# Patient Record
Sex: Female | Born: 1980 | Race: White | Hispanic: No | Marital: Married | Smoking: Never smoker
Health system: Southern US, Community
[De-identification: ages and names within clinical notes are randomized; demographics above are authoritative.]

## PROBLEM LIST (undated history)

## (undated) ENCOUNTER — Inpatient Hospital Stay: Payer: Self-pay

## (undated) DIAGNOSIS — K219 Gastro-esophageal reflux disease without esophagitis: Secondary | ICD-10-CM

## (undated) DIAGNOSIS — R51 Headache: Secondary | ICD-10-CM

## (undated) DIAGNOSIS — F32A Depression, unspecified: Secondary | ICD-10-CM

## (undated) DIAGNOSIS — R519 Headache, unspecified: Secondary | ICD-10-CM

## (undated) DIAGNOSIS — F329 Major depressive disorder, single episode, unspecified: Secondary | ICD-10-CM

## (undated) DIAGNOSIS — J45909 Unspecified asthma, uncomplicated: Secondary | ICD-10-CM

## (undated) HISTORY — PX: WISDOM TOOTH EXTRACTION: SHX21

## (undated) HISTORY — PX: OTHER SURGICAL HISTORY: SHX169

## (undated) HISTORY — PX: GANGLION CYST EXCISION: SHX1691

## (undated) HISTORY — DX: Depression, unspecified: F32.A

## (undated) HISTORY — PX: TONSILLECTOMY AND ADENOIDECTOMY: SUR1326

## (undated) HISTORY — DX: Major depressive disorder, single episode, unspecified: F32.9

---

## 2007-02-12 ENCOUNTER — Emergency Department: Payer: Self-pay | Admitting: Emergency Medicine

## 2007-03-05 ENCOUNTER — Emergency Department: Payer: Self-pay | Admitting: Emergency Medicine

## 2007-05-08 ENCOUNTER — Encounter: Payer: Self-pay | Admitting: Maternal & Fetal Medicine

## 2007-06-23 ENCOUNTER — Observation Stay: Payer: Self-pay

## 2007-08-20 ENCOUNTER — Emergency Department: Payer: Self-pay | Admitting: Emergency Medicine

## 2007-08-24 ENCOUNTER — Observation Stay: Payer: Self-pay | Admitting: Unknown Physician Specialty

## 2007-10-03 ENCOUNTER — Inpatient Hospital Stay: Payer: Self-pay

## 2010-09-01 ENCOUNTER — Emergency Department: Payer: Self-pay | Admitting: Unknown Physician Specialty

## 2012-10-06 ENCOUNTER — Encounter: Payer: Self-pay | Admitting: Obstetrics and Gynecology

## 2012-10-20 ENCOUNTER — Encounter: Payer: Self-pay | Admitting: Maternal & Fetal Medicine

## 2013-03-05 ENCOUNTER — Inpatient Hospital Stay: Payer: Self-pay | Admitting: Obstetrics and Gynecology

## 2013-03-05 LAB — CBC WITH DIFFERENTIAL/PLATELET
Basophil #: 0 10*3/uL (ref 0.0–0.1)
Lymphocyte %: 15.5 %
MCH: 29.2 pg (ref 26.0–34.0)
MCHC: 34.5 g/dL (ref 32.0–36.0)
MCV: 84 fL (ref 80–100)
Monocyte %: 5.5 %
Neutrophil #: 7.8 10*3/uL — ABNORMAL HIGH (ref 1.4–6.5)
Neutrophil %: 78.6 %
Platelet: 143 10*3/uL — ABNORMAL LOW (ref 150–440)
RDW: 13.8 % (ref 11.5–14.5)
WBC: 9.9 10*3/uL (ref 3.6–11.0)

## 2014-11-12 NOTE — Consult Note (Signed)
Referral Information:  Reason for Referral History of macrosomia and shoulder dystocia after her first delivery   Referring Physician [facility] (Dr. Bonney Aid)   Prenatal Hx Patient states that she had a shoulder dystocia with her first delivery (9#1oz) that required an episiotomy for delivery; her daughter had no deficits on exam and required no special follow up.  She thinks that she has a "weak" shoulder, though, and that she sometimes has difficulty with cheerleading moves because of it.  Her next delivery (8#9oz) was uncomplicated other than a compound presentation and double nuchal cord.   Past Obstetrical Hx 1.  SAB age 34, first trimester, no compliations 2.  2004 42wk NVD of female infant, 9#1oz, shoulder dystocia requiring episiotomy; no deficits or special care received.  Sacral dimple. 3.  2009 40wk NVD of female infant, 8#9oz, no shoulder dystocia but delivery complicated by compound presentation and double nuchal cord requiring NICU resucitation; first apgar was 3.  Didn't required NICU admission, though.  Sacral dimple. 4.  Current   Home Medications: Medication Instructions Status  Prenatal 1 Prenatal Multivitamins with Folic Acid 1 mg oral capsule 1 cap(s) orally once a day Active  folic acid 1 mg oral tablet 3 tab(s) orally once a day Active   Allergies:   No Known Allergies:   Vital Signs/Notes:  Nursing Vital Signs: **Vital Signs.:   17-Mar-14 09:05  Vital Signs Type Routine  Temperature Temperature (F) 97.7  Celsius 36.5  Pulse Pulse 89  Respirations Respirations 16  Systolic BP Systolic BP 105  Diastolic BP (mmHg) Diastolic BP (mmHg) 56  Mean BP 72   Perinatal Consult:  LMP 02-Jun-2012   PGyn Hx Denies abnormal PAPs or STDs   Past Medical History cont'd Denies   PSurg Hx Ganglion cyst on left hand, deviated septum repair, tubes/adenoids/tonsillectomy   FHx Both GMs with diabetes; mom with thyroid problems; MGM died in her 76's ? 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  Pregnancy complicated by 2 children with sacral dimples but no evidence of open spina bifida (tetra screen with AFP was normal) and first delivery comlicated by macrosomia, in the absence of diabetes, and shoulder dystocia with no neurological squelae.   Recommendations 1.  Agree with plan for early glucola 2.  Will schedule detailed anatomy ultrasound 3.  Genetic counseling performed today (history updated from prior counseling session) due to 2 children with sacral dimple and husband with spina bifida occulta. 4.  As the patient's first child had no diagnosed sequelae from the shoulder dytocia and the the second pregnancy was uncomplicated from that standpoint, do not feel (nor does the patient) that primary c/s is warranted unless she develops diabetes and/or macrosomia in this pregnancy (4500 grams with diabetes, 5000 grams without). 5.  As the patient does have a history of larger babies, however, would consider third trimester ultrasound for growth to help with delivery management.   Plan:  Genetic Counseling yes   Prenatal Diagnosis Options  Level II US   Ultrasound at what gestational ages 5432-34 weeks   Delivery Mode Vaginal   Additional Testing Folate/prenatal vitamins    Total Time Spent with Patient 15 minutes   >50% of visit spent in couseling/coordination of care yes   Office Use Only [id number]  Level 1 (15min) NEW office consult prob focused   Coding  Description: FETAL - 2nd/3rd TRIMESTER INDICATION(S).   Large for date - Macrosomia.   MATERNAL CONDITIONS/HISTORY INDICATION(S).  Electronic Signatures: Kirby FunkEllestad, Chou Busler (MD)  (Signed 17-Mar-14 11:13)  Authored: Referral, Home Medications, Allergies, Vital Signs/Notes, Consult, Exam, Impression, Plan, Billing, Coding Description   Last Updated: 17-Mar-14 11:13 by Kirby FunkEllestad, Nakai Pollio (MD)

## 2017-12-20 ENCOUNTER — Encounter: Payer: Self-pay | Admitting: Maternal Newborn

## 2017-12-20 ENCOUNTER — Ambulatory Visit (INDEPENDENT_AMBULATORY_CARE_PROVIDER_SITE_OTHER): Payer: Medicaid Other | Admitting: Maternal Newborn

## 2017-12-20 VITALS — BP 100/80 | Ht 62.25 in | Wt 175.0 lb

## 2017-12-20 DIAGNOSIS — Z3490 Encounter for supervision of normal pregnancy, unspecified, unspecified trimester: Secondary | ICD-10-CM | POA: Insufficient documentation

## 2017-12-20 DIAGNOSIS — Z113 Encounter for screening for infections with a predominantly sexual mode of transmission: Secondary | ICD-10-CM

## 2017-12-20 DIAGNOSIS — Z3481 Encounter for supervision of other normal pregnancy, first trimester: Secondary | ICD-10-CM

## 2017-12-20 DIAGNOSIS — Z6831 Body mass index (BMI) 31.0-31.9, adult: Secondary | ICD-10-CM

## 2017-12-20 DIAGNOSIS — Z0189 Encounter for other specified special examinations: Secondary | ICD-10-CM

## 2017-12-20 NOTE — Progress Notes (Signed)
12/20/2017   Chief Complaint: Amenorrhea, positive home pregnancy test, desires prenatal care.  Transfer of Care Patient: no  History of Present Illness: [name] is a 37 y.o. [phone] at 6w 4d based on Patient's last menstrual period on 11/04/2017 (approximate), with an Estimated Date of Delivery: 08/11/2018 with the above CC.   Her periods were: regular periods every 28-32 days She was using no method when she conceived.  She has Positive signs or symptoms of nausea of pregnancy. She has Negative signs or symptoms of miscarriage or preterm labor She identifies Negative Zika risk factors for her and her partner On any different medications around the time she conceived/early pregnancy: No  History of varicella: Yes   ROS: She also reports fatigue, changes in appetite, nasal congestion, breast tenderness, frequent urination, and depression. Review of systems was otherwise negative, except as stated in the above HPI.  OBGYN History: As per HPI. OB History  Gravida Para Term Preterm AB Living  SAB TAB Ectopic Multiple Live Births  1       3    # Outcome Date GA Lbr Len/2nd Weight Sex Delivery Anes PTL Lv  5 Current           4 Term 03/05/13   7 lb 6 oz (3.345 kg) M Vag-Spont   LIV  3 Term 10/04/07 [redacted]w[redacted]d  8 lb 9 oz (3.884 kg) M Vag-Spont   LIV     Birth Comments: IOL AT 35.3WKS FOR HX SHOULDER DYSTOCIA.  2 Term 05/26/03 [redacted]w[redacted]d  9 lb 1 oz (4.111 kg) F Vag-Spont   LIV     Complications: Shoulder Dystocia  1 SAB             Any issues with any prior pregnancies: yes, SAB with G1 and shoulder dystocia with G2. Any prior children are healthy, doing well, without any problems or issues: yes History of pap smears: Yes. Last pap smear 08/27/2012.  NILM/HPV Negative  History of STIs: No   Past Medical History: Past Medical History:  Diagnosis Date  . Depression     Past Surgical History: Past Surgical History:  Procedure Laterality Date  . deviated septum repair     . GANGLION CYST EXCISION    . TONSILLECTOMY AND ADENOIDECTOMY     PART ADENOID  . tubes in ears    . WISDOM TOOTH EXTRACTION     BOTTOM TWO    Family History:  Family History  Problem Relation Age of Onset  . Diabetes Maternal Grandmother   . Heart disease Maternal Grandfather   . Diabetes Paternal Grandmother   . Alzheimer's disease Paternal Grandmother   . Cancer Other        OVARIAN   She denies any female cancers, bleeding or blood clotting disorders.  She reports a history of intellectual disability, birth defects or genetic disorders in her or the FOB's history: she has a son with autism and her husband had a neural tube defect at birth.  Social History:  Social History   Socioeconomic History  . Marital status: Married    Spouse name: Not on file  . Number of children: 3  . Years of education: 26  . Highest education level: Not on file  Occupational History  . Occupation: STAY AT HOME MOM  Social Needs  . Financial resource strain: Not on file  . Food insecurity:    Worry: Not on file    Inability:  Not on file  . Transportation needs:    Medical: Not on file    Non-medical: Not on file  Tobacco Use  . Smoking status: Never Smoker  . Smokeless tobacco: Never Used  Substance and Sexual Activity  . Alcohol use: Not Currently  . Drug use: Never  . Sexual activity: Yes    Birth control/protection: None  Lifestyle  . Physical activity:    Days per week: Not on file    Minutes per session: Not on file  . Stress: Not on file  Relationships  . Social connections:    Talks on phone: Not on file    Gets together: Not on file    Attends religious service: Not on file    Active member of club or organization: Not on file    Attends meetings of clubs or organizations: Not on file    Relationship status: Not on file  . Intimate partner violence:    Fear of current or ex partner: Not on file    Emotionally abused: Not on file    Physically abused: Not on file      Forced sexual activity: Not on file  Other Topics Concern  . Not on file  Social History Narrative  . Not on file   Any cats in the household: yes, aware to avoid cat litter and feces and her daughter changes the litter box. Denies history of and current domestic violence.  Allergy: No Known Allergies  Current Outpatient Medications: No current outpatient medications on file.   Physical Exam:   BP 100/80   Ht 5' 2.25" (1.581 m)   Wt 175 lb (79.4 kg)   LMP 11/04/2017 (Approximate)   BMI 31.75 kg/m  Body mass index is 31.75 kg/m. Constitutional: Well nourished, well developed female in no acute distress.  Neck:  Supple, normal appearance, and no thyromegaly  Cardiovascular: S1, S2 normal, no murmur, rub or gallop, regular rate and rhythm Respiratory:  Clear to auscultation bilaterally. Normal respiratory effort Abdomen: positive bowel sounds and no masses, hernias; diffusely non tender to palpation, non distended Breasts: breasts appear normal, no suspicious masses, no skin or nipple changes or axillary nodes. Neuro/Psych:  Normal mood and affect.  Skin:  Warm and dry.  Lymphatic:  No inguinal lymphadenopathy.   Pelvic exam: is not limited by body habitus External genitalia, Bartholin's glands, Urethra, Skene's glands: within normal limits Vagina: within normal limits and with no blood in the vault  Cervix: normal appearing cervix without discharge or lesions, closed/long/high Uterus:  enlarged, c/w early pregnancy Adnexa:  negative for mass; tenderness on left  Assessment: [name] is a 37 y.o. [id number] at 6w 4d based on Patient's last menstrual period on 11/04/2017 (approximate), with an Estimated Date of Delivery: 08/11/2018, presenting for prenatal care.  Plan:  1) Avoid alcoholic beverages. 2) Patient encouraged not to smoke.  3) Discontinue the use of all non-medicinal drugs and chemicals.  4) Take prenatal vitamins daily.  5) Seatbelt use advised 6)  Nutrition, food safety (fish, cheese advisories, and high nitrite foods) and exercise discussed. 7) Hospital and practice style delivering at [facility] discussed  8) Patient is asked about travel to areas at risk for the Zika virus, and counseled to avoid travel and exposure to mosquitoes or sexual partners who may have themselves been exposed to the virus. Testing is discussed, and will be ordered as appropriate.  9) Genetic Screening, such as with 1st Trimester Screening, cell free fetal DNA, AFP testing,  and Ultrasound, as well as with amniocentesis and CVS as appropriate, is discussed with patient. She plans to have genetic testing this pregnancy. 10) Early GTT with NOB labs. 11) Ultrasound ordered for dating/viability. 12) Bonjesta sample for nausea.  Problem list reviewed and updated.  Return in about 1 week (around 12/27/2017) for ROB following ultrasound/GTT/NOB labs.  Marcelyn Bruins, CNM [facility], [facility] 12/20/2017  10:49 AM

## 2017-12-20 NOTE — Progress Notes (Signed)
C/O some occ pain ovary pain - hyst of cyst; nausea.rj

## 2017-12-22 LAB — URINE CULTURE

## 2017-12-22 LAB — URINE DRUG PANEL 7
Amphetamines, Urine: NEGATIVE ng/mL
BARBITURATE QUANT UR: NEGATIVE ng/mL
BENZODIAZEPINE QUANT UR: NEGATIVE ng/mL
CANNABINOID QUANT UR: NEGATIVE ng/mL
Cocaine (Metab.): NEGATIVE ng/mL
Opiate Quant, Ur: NEGATIVE ng/mL
PCP QUANT UR: NEGATIVE ng/mL

## 2017-12-22 LAB — GC/CHLAMYDIA PROBE AMP
CHLAMYDIA, DNA PROBE: NEGATIVE
Neisseria gonorrhoeae by PCR: NEGATIVE

## 2017-12-24 ENCOUNTER — Other Ambulatory Visit: Payer: Medicaid Other

## 2017-12-24 ENCOUNTER — Ambulatory Visit (INDEPENDENT_AMBULATORY_CARE_PROVIDER_SITE_OTHER): Payer: Medicaid Other

## 2017-12-24 DIAGNOSIS — Z3481 Encounter for supervision of other normal pregnancy, first trimester: Secondary | ICD-10-CM

## 2017-12-24 DIAGNOSIS — Z0189 Encounter for other specified special examinations: Secondary | ICD-10-CM | POA: Diagnosis not present

## 2017-12-24 DIAGNOSIS — Z6831 Body mass index (BMI) 31.0-31.9, adult: Secondary | ICD-10-CM | POA: Diagnosis not present

## 2017-12-24 DIAGNOSIS — Z113 Encounter for screening for infections with a predominantly sexual mode of transmission: Secondary | ICD-10-CM

## 2017-12-25 ENCOUNTER — Encounter: Payer: Medicaid Other | Admitting: Advanced Practice Midwife

## 2017-12-25 LAB — RPR+RH+ABO+RUB AB+AB SCR+CB...
Antibody Screen: NEGATIVE
HEP B S AG: NEGATIVE
HIV SCREEN 4TH GENERATION: NONREACTIVE
Hematocrit: 37.3 % (ref 34.0–46.6)
Hemoglobin: 12.3 g/dL (ref 11.1–15.9)
MCH: 29.1 pg (ref 26.6–33.0)
MCHC: 33 g/dL (ref 31.5–35.7)
MCV: 88 fL (ref 79–97)
PLATELETS: 159 10*3/uL (ref 150–450)
RBC: 4.22 x10E6/uL (ref 3.77–5.28)
RDW: 13.9 % (ref 12.3–15.4)
RPR: NONREACTIVE
RUBELLA: 1.02 {index} (ref >0.99)
Rh Factor: POSITIVE
VARICELLA: 580 {index} (ref >165)
WBC: 6.4 10*3/uL (ref 3.4–10.8)

## 2017-12-25 LAB — GLUCOSE, 1 HOUR GESTATIONAL: Gestational Diabetes Screen: 105 mg/dL (ref 65–139)

## 2017-12-26 ENCOUNTER — Encounter: Payer: Medicaid Other | Admitting: Obstetrics and Gynecology

## 2017-12-28 ENCOUNTER — Emergency Department: Payer: Medicaid Other

## 2017-12-28 ENCOUNTER — Emergency Department
Admission: EM | Admit: 2017-12-28 | Discharge: 2017-12-28 | Disposition: A | Discharge status: Home / Self Care | Payer: Medicaid Other | Attending: Emergency Medicine | Admitting: Emergency Medicine

## 2017-12-28 ENCOUNTER — Encounter: Payer: Self-pay | Admitting: Emergency Medicine

## 2017-12-28 ENCOUNTER — Other Ambulatory Visit: Payer: Self-pay

## 2017-12-28 DIAGNOSIS — O200 Threatened abortion: Secondary | ICD-10-CM

## 2017-12-28 DIAGNOSIS — Z3A01 Less than 8 weeks gestation of pregnancy: Secondary | ICD-10-CM | POA: Diagnosis not present

## 2017-12-28 DIAGNOSIS — N939 Abnormal uterine and vaginal bleeding, unspecified: Secondary | ICD-10-CM

## 2017-12-28 DIAGNOSIS — O209 Hemorrhage in early pregnancy, unspecified: Secondary | ICD-10-CM | POA: Diagnosis present

## 2017-12-28 LAB — HCG, QUANTITATIVE, PREGNANCY: hCG, Beta Chain, Quant, S: 72249 m[IU]/mL — ABNORMAL HIGH (ref <5)

## 2017-12-28 NOTE — Discharge Instructions (Signed)
Fortunately today your ultrasound was reassuring.  Please follow-up with your OB gynecologist this coming Thursday as scheduled and return to the emergency department sooner for any concerns such as worsening pain, worsening bleeding, or for any other issues whatsoever.  It was a pleasure to take care of you today, and thank you for coming to our emergency department.  If you have any questions or concerns before leaving please ask the nurse to grab me and I'm more than happy to go through your aftercare instructions again.  If you were prescribed any opioid pain medication today such as Norco, Vicodin, Percocet, morphine, hydrocodone, or oxycodone please make sure you do not drive when you are taking this medication as it can alter your ability to drive safely.  If you have any concerns once you are home that you are not improving or are in fact getting worse before you can make it to your follow-up appointment, please do not hesitate to call 911 and come back for further evaluation.  Robin BrittleNeil Travonta Gill, MD  Results for orders placed or performed during the hospital encounter of 12/28/17  hCG, quantitative, pregnancy  Result Value Ref Range   hCG, Beta Chain, Quant, S 72,249 (H) <5 mIU/mL   [place] Ob Comp < 14 Wks  Result Date: 12/28/2017 CLINICAL DATA:  Vaginal bleeding EXAM: OBSTETRIC <14 WK US AND TRANSVAGINAL OB US TECHNIQUE: Both transabdominal and transvaginal ultrasound examinations were performed for complete evaluation of the gestation as well as the maternal uterus, adnexal regions, and pelvic cul-de-sac. Transvaginal technique was performed to assess early pregnancy. COMPARISON:  None. FINDINGS: Intrauterine gestational sac: Visualized Yolk sac:  Visualized Embryo: Visualized Cardiac Activity: Visualized Heart Rate: 119 bpm CRL:  7 mm   6 w   4 d                  US EDC: January 18, 2019 Subchorionic hemorrhage: There is a small subchorionic hemorrhage measuring 7 x 7 mm. Maternal uterus/adnexae:  Cervical os is closed. Right ovary measures 2.3 x 1.7 x 1.9 cm. Left ovary measures 4.9 x 3.1 x 4.1 cm. There is a cyst arising from the left ovary measuring 3.2 x 3.0 x 3.7 cm, likely a corpus luteum. No free pelvic fluid. IMPRESSION: Single live intrauterine gestation estimated gestational age of 6+ weeks. Small subchorionic hemorrhage. Left ovarian cyst, a likely rather large corpus luteum. No free pelvic fluid. Electronically Signed   By: Bretta BangWilliam  Woodruff III M.D.   On: 12/28/2017 08:33   [place] Ob Comp Less 14 Wks  Result Date: 12/25/2017 ULTRASOUND REPORT Patient Name: Robin French DOB: 12/27/1980 MRN: [id number] Location: [facility] Date of Service: 12/24/2017 Indications:dating Findings: Mason JimSingleton intrauterine pregnancy is visualized with a CRL consistent with 1757w1d gestation, giving an (U/S) EDD of 08/18/2018. The (U/S) EDD is not consistent with the clinically established EDD of 08/11/2018. FHR: 112 bpm CRL measurement: 4.6 mm Yolk sac is visualized and appears normal and early anatomy is normal. Amnion: visualized and appears normal Right Ovary is normal in appearance. Left Ovary is not normal appearance, contains a cyst measuring 4.08 x 3.12cm Corpus luteal cyst:  is not visualized Survey of the adnexa demonstrates no adnexal masses. There is no free peritoneal fluid in the cul de sac. Impression: 1. 4357w1d Viable Singleton Intrauterine pregnancy by U/S. 2. (U/S) EDD is not consistent with Clinically established EDD of 08/11/2018 3. EDD is now1/27/2020 based on today's ultrasound Recommendations: 1.Clinical correlation with the patient's History and Physical  Exam. Willette Alma, RDMS, RVT Based on ultrasound examination today, EDC changed to 08/11/2018 [place] Criteria for Re-dating Pregnancy Dating Dating Discrepancy [redacted]w[redacted]d >5 days [redacted]w[redacted]d - [redacted]w[redacted]d >7 days [redacted]w[redacted]d - [redacted]w[redacted]d >7 days [redacted]w[redacted]d - [redacted]w[redacted]d >10 days [redacted]w[redacted]d - [redacted]w[redacted]d >14 days [redacted]w[redacted]d and above >21 days ACOG Committee Opinion 700 May 2017 "Methods for Estimating  the Due Date" There is a viable singleton gestation.  The fetal biometry does not correlates with established dating. Detailed evaluation of the fetal anatomy is precluded by early gestational age.  It must be noted that a normal ultrasound particular at this early gestational age is unable to rule out fetal aneuploidy, risk of first trimester miscarriage, or anatomic birth defects. Vena Austria, MD, [facility], [facility] 12/25/2017, 7:17 AM   US Ob Transvaginal  Result Date: 12/28/2017 CLINICAL DATA:  Vaginal bleeding EXAM: OBSTETRIC <14 WK [place] AND TRANSVAGINAL OB US TECHNIQUE: Both transabdominal and transvaginal ultrasound examinations were performed for complete evaluation of the gestation as well as the maternal uterus, adnexal regions, and pelvic cul-de-sac. Transvaginal technique was performed to assess early pregnancy. COMPARISON:  None. FINDINGS: Intrauterine gestational sac: Visualized Yolk sac:  Visualized Embryo: Visualized Cardiac Activity: Visualized Heart Rate: 119 bpm CRL:  7 mm   6 w   4 d                  [place] EDC: January 18, 2019 Subchorionic hemorrhage: There is a small subchorionic hemorrhage measuring 7 x 7 mm. Maternal uterus/adnexae: Cervical os is closed. Right ovary measures 2.3 x 1.7 x 1.9 cm. Left ovary measures 4.9 x 3.1 x 4.1 cm. There is a cyst arising from the left ovary measuring 3.2 x 3.0 x 3.7 cm, likely a corpus luteum. No free pelvic fluid. IMPRESSION: Single live intrauterine gestation estimated gestational age of 6+ weeks. Small subchorionic hemorrhage. Left ovarian cyst, a likely rather large corpus luteum. No free pelvic fluid. Electronically Signed   By: Bretta Bang III M.D.   On: 12/28/2017 08:33

## 2017-12-28 NOTE — ED Notes (Signed)
MD at bedside. 

## 2017-12-28 NOTE — ED Provider Notes (Signed)
[facility] Emergency Department Provider Note  ____________________________________________   First MD Initiated Contact with Patient 12/28/17 [phone]     (approximate)  I have reviewed the triage vital signs and the nursing notes.   HISTORY  Chief Complaint Vaginal Bleeding    HPI Robin French is a 37 y.o. female who self presents the emergency department with painless vaginal bleeding that began this morning.  She is G5, P3 with one miscarriage in the past.  She is roughly 6 to [redacted] weeks pregnant with a desired but unplanned pregnancy.  She was not taking fertility medications.  She does not smoke.  She does have OB gynecology follow-up with [facility].  Her symptoms began suddenly this morning.  She currently has mild suprapubic cramping discomfort.  Nothing seems to make the symptoms better or worse.  Her pain is nonradiating.  Past Medical History:  Diagnosis Date  . Depression     Patient Active Problem List   Diagnosis Date Noted  . Supervision of normal pregnancy 12/20/2017  . Adult BMI 31.0-31.9 kg/sq m 12/20/2017    Past Surgical History:  Procedure Laterality Date  . deviated septum repair    . GANGLION CYST EXCISION    . TONSILLECTOMY AND ADENOIDECTOMY     PART ADENOID  . tubes in ears    . WISDOM TOOTH EXTRACTION     BOTTOM TWO    Prior to Admission medications   Not on File    Allergies Patient has no known allergies.  Family History  Problem Relation Age of Onset  . Diabetes Maternal Grandmother   . Heart disease Maternal Grandfather   . Diabetes Paternal Grandmother   . Alzheimer's disease Paternal Grandmother   . Cancer Other        OVARIAN    Social History Social History   Tobacco Use  . Smoking status: Never Smoker  . Smokeless tobacco: Never Used  Substance Use Topics  . Alcohol use: Not Currently  . Drug use: Never    Review of Systems Constitutional: No fever/chills Eyes: No visual  changes. ENT: No sore throat. Cardiovascular: Denies chest pain. Respiratory: Denies shortness of breath. Gastrointestinal: Positive for abdominal pain.  No nausea, no vomiting.  No diarrhea.  No constipation. Genitourinary: Negative for dysuria. Musculoskeletal: Negative for back pain. Skin: Negative for rash. Neurological: Negative for headaches, focal weakness or numbness.   ____________________________________________   PHYSICAL EXAM:  VITAL SIGNS: ED Triage Vitals  Enc Vitals Group     BP 12/28/17 0714 114/67     Pulse Rate 12/28/17 0714 83     Resp 12/28/17 0714 16     Temp 12/28/17 0714 97.9 F (36.6 C)     Temp Source 12/28/17 0714 Oral     SpO2 12/28/17 0714 100 %     Weight 12/28/17 0645 175 lb (79.4 kg)     Height 12/28/17 0645 5' 2.25" (1.581 m)     Head Circumference --      Peak Flow --      Pain Score 12/28/17 0645 2     Pain Loc --      Pain Edu? --      Excl. in GC? --     Constitutional: Alert and oriented x4 pleasant cooperative speaks full sentences Eyes: PERRL EOMI. Head: Atraumatic. Nose: No congestion/rhinnorhea. Mouth/Throat: No trismus Neck: No stridor.   Cardiovascular: Normal rate, regular rhythm. Peri Jefferson peripheral circulation. Respiratory: Normal respiratory effort.  No retractions.  Gastrointestinal: Soft nondistended nontender no rebound or guarding no peritonitis Musculoskeletal: No lower extremity edema   Neurologic:  Normal speech and language. No gross focal neurologic deficits are appreciated. Skin:  Skin is warm, dry and intact. No rash noted. Psychiatric: Mood and affect are normal. Speech and behavior are normal.    ____________________________________________   DIFFERENTIAL includes but not limited to  Threatened abortion, inevitable abortion, completed abortion, ectopic pregnancy ____________________________________________   LABS (all labs ordered are listed, but only abnormal results are displayed)  Labs Reviewed   HCG, QUANTITATIVE, PREGNANCY - Abnormal; Notable for the following components:      Result Value   hCG, Beta Chain, Quant, S 72,249 (*)    All other components within normal limits    Lab work reviewed by me shows appropriate hCG __________________________________________  EKG   ____________________________________________  RADIOLOGY  Pelvic ultrasound reviewed by me shows intrauterine pregnancy with subchorionic hematoma ____________________________________________   PROCEDURES  Procedure(s) performed: no  Procedures  Critical Care performed: no  Observation: no ____________________________________________   INITIAL IMPRESSION / ASSESSMENT AND PLAN / ED COURSE  Pertinent labs & imaging results that were available during my care of the patient were reviewed by me and considered in my medical decision making (see chart for details).      ----------------------------------------- 8:45 AM on 12/28/2017 -----------------------------------------  The patient has very minimal discomfort.  Scant bleeding.  Pelvic ultrasound shows intrauterine pregnancy with a small subchorionic hematoma.  She is Rh+.  She was not taking any fertility medications.  She has follow-up this coming week with her OB gynecologist.  Pelvic rest is been discussed.  No need for pelvic exam at this time.  Strict return precautions have been given and the patient verbalizes understanding agreement plan.  ____________________________________________   FINAL CLINICAL IMPRESSION(S) / ED DIAGNOSES  Final diagnoses:  Threatened abortion      NEW MEDICATIONS STARTED DURING THIS VISIT:  New Prescriptions   No medications on file     Note:  This document was prepared using Dragon voice recognition software and may include unintentional dictation errors.     Merrily Brittleifenbark, Hernandez Losasso, MD 12/28/17 [phone]

## 2017-12-28 NOTE — ED Triage Notes (Signed)
Pt ambulatory to triage with no diffiuclty. Pt reports approx 7 to [redacted] weeks pregnant [facility] Pt reports vaginal bleeding started this morning. Pt G5P3A1.

## 2017-12-28 NOTE — ED Notes (Signed)
Pt taken to US via stretcher

## 2017-12-28 NOTE — ED Notes (Signed)
Pt states vaginal bleeding that began this AM. Between 7 and [redacted] weeks pregnant. [facility], had scan done with heart beat present. Was told "large cyst on my left side." states uncomfortable feeling lower abd/pelvic. States "very small" clots present. Not enough bleeding for a pad an hour. Alert, oriented, ambulatory. No distress noted.

## 2017-12-29 ENCOUNTER — Encounter: Payer: Self-pay | Admitting: Maternal Newborn

## 2017-12-29 NOTE — Patient Instructions (Signed)
First Trimester of Pregnancy The first trimester of pregnancy is from week 1 until the end of week 13 (months 1 through 3). A week after a sperm fertilizes an egg, the egg will implant on the wall of the uterus. This embryo will begin to develop into a baby. Genes from you and your partner will form the baby. The female genes will determine whether the baby will be a boy or a girl. At 6-8 weeks, the eyes and face will be formed, and the heartbeat can be seen on ultrasound. At the end of 12 weeks, all the baby's organs will be formed. Now that you are pregnant, you will want to do everything you can to have a healthy baby. Two of the most important things are to get good prenatal care and to follow your health care provider's instructions. Prenatal care is all the medical care you receive before the baby's birth. This care will help prevent, find, and treat any problems during the pregnancy and childbirth. Body changes during your first trimester Your body goes through many changes during pregnancy. The changes vary from woman to woman.  You may gain or lose a couple of pounds at first.  You may feel sick to your stomach (nauseous) and you may throw up (vomit). If the vomiting is uncontrollable, call your health care provider.  You may tire easily.  You may develop headaches that can be relieved by medicines. All medicines should be approved by your health care provider.  You may urinate more often. Painful urination may mean you have a bladder infection.  You may develop heartburn as a result of your pregnancy.  You may develop constipation because certain hormones are causing the muscles that push stool through your intestines to slow down.  You may develop hemorrhoids or swollen veins (varicose veins).  Your breasts may begin to grow larger and become tender. Your nipples may stick out more, and the tissue that surrounds them (areola) may become darker.  Your gums may bleed and may be  sensitive to brushing and flossing.  Dark spots or blotches (chloasma, mask of pregnancy) may develop on your face. This will likely fade after the baby is born.  Your menstrual periods will stop.  You may have a loss of appetite.  You may develop cravings for certain kinds of food.  You may have changes in your emotions from day to day, such as being excited to be pregnant or being concerned that something may go wrong with the pregnancy and baby.  You may have more vivid and strange dreams.  You may have changes in your hair. These can include thickening of your hair, rapid growth, and changes in texture. Some women also have hair loss during or after pregnancy, or hair that feels dry or thin. Your hair will most likely return to normal after your baby is born.  What to expect at prenatal visits During a routine prenatal visit:  You will be weighed to make sure you and the baby are growing normally.  Your blood pressure will be taken.  Your abdomen will be measured to track your baby's growth.  The fetal heartbeat will be listened to between weeks 10 and 14 of your pregnancy.  Test results from any previous visits will be discussed.  Your health care provider may ask you:  How you are feeling.  If you are feeling the baby move.  If you have had any abnormal symptoms, such as leaking fluid, bleeding, severe headaches,   or abdominal cramping.  If you are using any tobacco products, including cigarettes, chewing tobacco, and electronic cigarettes.  If you have any questions.  Other tests that may be performed during your first trimester include:  Blood tests to find your blood type and to check for the presence of any previous infections. The tests will also be used to check for low iron levels (anemia) and protein on red blood cells (Rh antibodies). Depending on your risk factors, or if you previously had diabetes during pregnancy, you may have tests to check for high blood  sugar that affects pregnant women (gestational diabetes).  Urine tests to check for infections, diabetes, or protein in the urine.  An ultrasound to confirm the proper growth and development of the baby.  Fetal screens for spinal cord problems (spina bifida) and Down syndrome.  HIV (human immunodeficiency virus) testing. Routine prenatal testing includes screening for HIV, unless you choose not to have this test.  You may need other tests to make sure you and the baby are doing well.  Follow these instructions at home: Medicines  Follow your health care provider's instructions regarding medicine use. Specific medicines may be either safe or unsafe to take during pregnancy.  Take a prenatal vitamin that contains at least 600 micrograms (mcg) of folic acid.  If you develop constipation, try taking a stool softener if your health care provider approves. Eating and drinking  Eat a balanced diet that includes fresh fruits and vegetables, whole grains, good sources of protein such as meat, eggs, or tofu, and low-fat dairy. Your health care provider will help you determine the amount of weight gain that is right for you.  Avoid raw meat and uncooked cheese. These carry germs that can cause birth defects in the baby.  Eating four or five small meals rather than three large meals a day may help relieve nausea and vomiting. If you start to feel nauseous, eating a few soda crackers can be helpful. Drinking liquids between meals, instead of during meals, also seems to help ease nausea and vomiting.  Limit foods that are high in fat and processed sugars, such as fried and sweet foods.  To prevent constipation: ? Eat foods that are high in fiber, such as fresh fruits and vegetables, whole grains, and beans. ? Drink enough fluid to keep your urine clear or pale yellow. Activity  Exercise only as directed by your health care provider. Most women can continue their usual exercise routine during  pregnancy. Try to exercise for 30 minutes at least 5 days a week. Exercising will help you: ? Control your weight. ? Stay in shape. ? Be prepared for labor and delivery.  Experiencing pain or cramping in the lower abdomen or lower back is a good sign that you should stop exercising. Check with your health care provider before continuing with normal exercises.  Try to avoid standing for long periods of time. Move your legs often if you must stand in one place for a long time.  Avoid heavy lifting.  Wear low-heeled shoes and practice good posture.  You may continue to have sex unless your health care provider tells you not to. Relieving pain and discomfort  Wear a good support bra to relieve breast tenderness.  Take warm sitz baths to soothe any pain or discomfort caused by hemorrhoids. Use hemorrhoid cream if your health care provider approves.  Rest with your legs elevated if you have leg cramps or low back pain.  If you develop   varicose veins in your legs, wear support hose. Elevate your feet for 15 minutes, 3-4 times a day. Limit salt in your diet. Prenatal care  Schedule your prenatal visits by the twelfth week of pregnancy. They are usually scheduled monthly at first, then more often in the last 2 months before delivery.  Write down your questions. Take them to your prenatal visits.  Keep all your prenatal visits as told by your health care provider. This is important. Safety  Wear your seat belt at all times when driving.  Make a list of emergency phone numbers, including numbers for family, friends, the hospital, and police and fire departments. General instructions  Ask your health care provider for a referral to a local prenatal education class. Begin classes no later than the beginning of month 6 of your pregnancy.  Ask for help if you have counseling or nutritional needs during pregnancy. Your health care provider can offer advice or refer you to specialists for help  with various needs.  Do not use hot tubs, steam rooms, or saunas.  Do not douche or use tampons or scented sanitary pads.  Do not cross your legs for long periods of time.  Avoid cat litter boxes and soil used by cats. These carry germs that can cause birth defects in the baby and possibly loss of the fetus by miscarriage or stillbirth.  Avoid all smoking, herbs, alcohol, and medicines not prescribed by your health care provider. Chemicals in these products affect the formation and growth of the baby.  Do not use any products that contain nicotine or tobacco, such as cigarettes and e-cigarettes. If you need help quitting, ask your health care provider. You may receive counseling support and other resources to help you quit.  Schedule a dentist appointment. At home, brush your teeth with a soft toothbrush and be gentle when you floss. Contact a health care provider if:  You have dizziness.  You have mild pelvic cramps, pelvic pressure, or nagging pain in the abdominal area.  You have persistent nausea, vomiting, or diarrhea.  You have a bad smelling vaginal discharge.  You have pain when you urinate.  You notice increased swelling in your face, hands, legs, or ankles.  You are exposed to fifth disease or chickenpox.  You are exposed to German measles (rubella) and have never had it. Get help right away if:  You have a fever.  You are leaking fluid from your vagina.  You have spotting or bleeding from your vagina.  You have severe abdominal cramping or pain.  You have rapid weight gain or loss.  You vomit blood or material that looks like coffee grounds.  You develop a severe headache.  You have shortness of breath.  You have any kind of trauma, such as from a fall or a car accident. Summary  The first trimester of pregnancy is from week 1 until the end of week 13 (months 1 through 3).  Your body goes through many changes during pregnancy. The changes vary from  woman to woman.  You will have routine prenatal visits. During those visits, your health care provider will examine you, discuss any test results you may have, and talk with you about how you are feeling. This information is not intended to replace advice given to you by your health care provider. Make sure you discuss any questions you have with your health care provider. Document Released: 07/03/2001 Document Revised: 06/20/2016 Document Reviewed: 06/20/2016 Elsevier Interactive Patient Education  2018 [employer]   [employer]  

## 2018-01-02 ENCOUNTER — Ambulatory Visit (INDEPENDENT_AMBULATORY_CARE_PROVIDER_SITE_OTHER): Payer: Medicaid Other | Admitting: Obstetrics & Gynecology

## 2018-01-02 VITALS — BP 120/80 | Wt 175.0 lb

## 2018-01-02 DIAGNOSIS — Z3481 Encounter for supervision of other normal pregnancy, first trimester: Secondary | ICD-10-CM

## 2018-01-02 DIAGNOSIS — Z6831 Body mass index (BMI) 31.0-31.9, adult: Secondary | ICD-10-CM

## 2018-01-02 DIAGNOSIS — Z3A01 Less than 8 weeks gestation of pregnancy: Secondary | ICD-10-CM

## 2018-01-02 NOTE — Patient Instructions (Signed)

## 2018-01-02 NOTE — Progress Notes (Signed)
  Subjective  Fetal Movement? no Contractions? no Leaking Fluid? no Vaginal Bleeding? no No pain or bleeding, mild nasuea  Objective  BP 120/80   Wt 175 lb (79.4 kg)   LMP 11/04/2017 (Approximate)   BMI 31.75 kg/m  General: NAD Pumonary: no increased work of breathing Abdomen: gravid, non-tender Extremities: no edema Psychiatric: mood appropriate, affect full  Assessment  37 y.o. [id number] at 7844w3d by  08/18/2018, by Ultrasound presenting for routine prenatal visit  Plan   Problem List Items Addressed This Visit      Other   Supervision of normal pregnancy   Adult BMI 31.0-31.9 kg/sq m    Other Visit Diagnoses    [redacted] weeks gestation of pregnancy    -  Primary    [facility] preferred for PNV Desires PP BTL Desires genetic testing, next visit All other labs reviewed, normal, normal glc Prior [facility] and ovarian cyst by US discussed; monitor for sx's  Annamarie MajorPaul Donnavan Covault, MD, Merlinda FrederickFACOG [facility], [facility] 01/02/2018  8:48 AM

## 2018-01-03 ENCOUNTER — Telehealth: Payer: Self-pay

## 2018-01-03 MED ORDER — PROVIDA DHA 16-16-1.25-110 MG PO CAPS: 1.0000 | ORAL_CAPSULE | Freq: Every day | ORAL | 11 refills | Status: DC

## 2018-01-03 NOTE — Telephone Encounter (Signed)
Pt saw RPH yesterday. Was supposed to have rx for PNV sent to [facility] pharmacy. Pharmacy has not rcvd rx. Cb#[phone]

## 2018-01-03 NOTE — Telephone Encounter (Signed)
Done

## 2018-01-03 NOTE — Telephone Encounter (Signed)
Called pt. Unable to leave message. VM box full.

## 2018-01-13 ENCOUNTER — Encounter: Payer: Self-pay | Admitting: Maternal Newborn

## 2018-01-14 ENCOUNTER — Encounter: Payer: Self-pay | Admitting: Maternal Newborn

## 2018-01-15 ENCOUNTER — Ambulatory Visit (INDEPENDENT_AMBULATORY_CARE_PROVIDER_SITE_OTHER): Payer: Medicaid Other

## 2018-01-15 ENCOUNTER — Encounter: Payer: Self-pay | Admitting: Maternal Newborn

## 2018-01-15 ENCOUNTER — Ambulatory Visit (INDEPENDENT_AMBULATORY_CARE_PROVIDER_SITE_OTHER): Payer: Medicaid Other | Admitting: Maternal Newborn

## 2018-01-15 VITALS — BP 104/64 | Wt 183.0 lb

## 2018-01-15 DIAGNOSIS — R102 Pelvic and perineal pain: Principal | ICD-10-CM

## 2018-01-15 DIAGNOSIS — O3481 Maternal care for other abnormalities of pelvic organs, first trimester: Secondary | ICD-10-CM | POA: Diagnosis not present

## 2018-01-15 DIAGNOSIS — O26891 Other specified pregnancy related conditions, first trimester: Secondary | ICD-10-CM

## 2018-01-15 DIAGNOSIS — Z3A09 9 weeks gestation of pregnancy: Secondary | ICD-10-CM

## 2018-01-15 DIAGNOSIS — N8312 Corpus luteum cyst of left ovary: Secondary | ICD-10-CM | POA: Diagnosis not present

## 2018-01-15 DIAGNOSIS — Z3481 Encounter for supervision of other normal pregnancy, first trimester: Secondary | ICD-10-CM

## 2018-01-15 NOTE — Progress Notes (Signed)
    Routine Prenatal Care Visit  Subjective  Robin French is a 37 y.o. [id number] at 48712w2d being seen today for ongoing prenatal care.  She is currently monitored for the following issues for this low-risk pregnancy and has Supervision of normal pregnancy and Adult BMI 31.0-31.9 kg/sq m on their problem list.  ----------------------------------------------------------------------------------- Patient reports intermittent pain in the area of her left ovary. It resolves spontaneously. She has seen some pink on the toilet paper when wiping after a bowel movement. She is concerned about the subchorionic hemorrhage that was seen on ultrasound during a visit to the emergency department earlier this month. Contractions: Not present. Vag. Bleeding: None.   ----------------------------------------------------------------------------------- The following portions of the patient's history were reviewed and updated as appropriate: allergies, current medications, past family history, past medical history, past social history, past surgical history and problem list. Problem list updated.   Objective  Blood pressure 104/64, weight 183 lb (83 kg), last menstrual period 11/04/2017. Pregravid weight 175 lb (79.4 kg) Total Weight Gain 8 lb (3.629 kg)  Fetal Status: Fetal Heart Rate (bpm): 175         General:  Alert, oriented and cooperative. Patient is in no acute distress.  Skin: Skin is warm and dry. No rash noted.   Cardiovascular: Normal heart rate noted  Respiratory: Normal respiratory effort, no problems with respiration noted  Abdomen: Soft, gravid, appropriate for gestational age. Pain/Pressure: Present     Pelvic:  Cervical exam deferred        Extremities: Normal range of motion.     Mental Status: Normal mood and affect. Normal behavior. Normal judgment and thought content.     Assessment   37 y.o. [id number] at 22712w2d, EDD 08/18/2018 by Ultrasound presenting for routine prenatal  visit.  Plan   FIFTH Problems (from 12/20/17 to present)    Problem Noted Resolved   Supervision of normal pregnancy 12/20/2017 by Oswaldo ConroySchmid, Jacelyn Y, CNM No   Overview Addendum 12/29/2017  5:29 PM by Oswaldo ConroySchmid, Jacelyn Y, CNM    Clinic [facility] Prenatal Labs  Dating 6w 1d US Blood type: O/Positive/-- (06/04 1157)   Genetic Screen 1 Screen:    AFP:     Quad:     NIPS: Antibody:Negative (06/04 1157)  Anatomic US  Rubella: 1.02 (06/04 1157) Varicella: Immune  GTT Early: 105              Third trimester:  RPR: Non Reactive (06/04 1157)   Rhogam  HBsAg: Negative (06/04 1157)   TDaP vaccine                       Flu Shot: HIV: Non Reactive (06/04 1157)   Baby Food                                GBS:   Contraception  Pap: 2014 NILM/HPV negative, declines until PP  CBB     CS/VBAC    Support Person               Ultrasound today shows single IUP, size=dates, FHR 175. No SCH seen. Left ovarian cyst present. Reviewed normal ultrasound findings.  Return in about 2 weeks (around 01/29/2018) for ROB and NIPT.  Marcelyn BruinsJacelyn Schmid, CNM 01/16/2018  12:30 PM

## 2018-01-15 NOTE — Progress Notes (Signed)
C/o pelvic pain - left ovary area; sees pink c BM; [facility]. rj

## 2018-01-30 ENCOUNTER — Encounter: Payer: Self-pay | Admitting: Maternal Newborn

## 2018-01-30 ENCOUNTER — Ambulatory Visit (INDEPENDENT_AMBULATORY_CARE_PROVIDER_SITE_OTHER): Payer: Medicaid Other | Admitting: Maternal Newborn

## 2018-01-30 VITALS — BP 100/60 | Wt 184.0 lb

## 2018-01-30 DIAGNOSIS — Z1379 Encounter for other screening for genetic and chromosomal anomalies: Secondary | ICD-10-CM

## 2018-01-30 DIAGNOSIS — Z3481 Encounter for supervision of other normal pregnancy, first trimester: Secondary | ICD-10-CM | POA: Diagnosis not present

## 2018-01-30 DIAGNOSIS — Z3A11 11 weeks gestation of pregnancy: Secondary | ICD-10-CM

## 2018-01-30 NOTE — Progress Notes (Signed)
C/o hasn't been feeling very well, doesn't know if it's sinuses or what.rj

## 2018-01-30 NOTE — Progress Notes (Signed)
    Routine Prenatal Care Visit  Subjective  Robin SolianCarroll K Kenley French is a 37 y.o. [phone] at 2241w3d being seen today for ongoing prenatal care.  She is currently monitored for the following issues for this low-risk pregnancy and has Supervision of normal pregnancy and Adult BMI 31.0-31.9 kg/sq m on their problem list.  ---------------------------------------------------------------------------------- Patient reports sinus or allergy symptoms of nasal congestion, drainage, sore throat..   Contractions: Not present. Vag. Bleeding: None.    ---------------------------------------------------------------------------------- The following portions of the patient's history were reviewed and updated as appropriate: allergies, current medications, past family history, past medical history, past social history, past surgical history and problem list. Problem list updated.   Objective  Blood pressure 100/60, weight 184 lb (83.5 kg), last menstrual period 11/04/2017. Pregravid weight 175 lb (79.4 kg) Total Weight Gain 9 lb (4.082 kg) Body mass index is 33.38 kg/m. Urinalysis: Urine Protein: Negative Urine Glucose: Negative  Fetal Status: Fetal Heart Rate (bpm): 150         General:  Alert, oriented and cooperative. Patient is in no acute distress.  Skin: Skin is warm and dry. No rash noted.   Cardiovascular: Normal heart rate noted  Respiratory: Normal respiratory effort, no problems with respiration noted  Abdomen: Soft, gravid, appropriate for gestational age. Pain/Pressure: Absent     Pelvic:  Cervical exam deferred        Extremities: Normal range of motion.  Edema: None  Mental Status: Normal mood and affect. Normal behavior. Normal judgment and thought content.     Assessment   37 y.o. [id number] at 2341w3d, EDD 08/18/2018 by Ultrasound presenting for routine prenatal visit.  Plan   FIFTH Problems (from 12/20/17 to present)    Problem Noted Resolved   Supervision of normal pregnancy  12/20/2017 by Oswaldo ConroySchmid, Jacelyn Y, CNM No   Overview Addendum 12/29/2017  5:29 PM by Oswaldo ConroySchmid, Jacelyn Y, CNM    [facility]inic [facility] Prenatal Labs  Dating 6w 1d US Blood type: O/Positive/-- (06/04 1157)   Genetic Screen 1 Screen:    AFP:     Quad:     NIPS: Antibody:Negative (06/04 1157)  Anatomic US  Rubella: 1.02 (06/04 1157) Varicella: Immune  GTT Early: 105              Third trimester:  RPR: Non Reactive (06/04 1157)   Rhogam  HBsAg: Negative (06/04 1157)   TDaP vaccine                       Flu Shot: HIV: Non Reactive (06/04 1157)   Baby Food                                GBS:   Contraception  Pap: 2014 NILM/HPV negative, declines until PP  CBB     CS/VBAC    Support Person               Bedside ultrasound showed FHR 150, good fetal movement.  Advised OTC medications for relief of sinus/allergy symptoms.  MaterniTi 21 done today.  Gestational age appropriate obstetric precautions were reviewed.  Return in about 1 month (around 02/27/2018) for ROB.  Marcelyn BruinsJacelyn Schmid, CNM 01/30/2018  10:56 AM

## 2018-02-04 ENCOUNTER — Telehealth: Payer: Self-pay | Admitting: Maternal Newborn

## 2018-02-04 NOTE — Telephone Encounter (Signed)
Patient is requesting results from NPIT. Patient will be unable to be reach via phone. Please send Mychart message.

## 2018-02-05 NOTE — Telephone Encounter (Signed)
Pt calling again for results of NIPT Test.

## 2018-02-05 NOTE — Telephone Encounter (Signed)
Reviewed chart. Results are not back yet. Pt notified

## 2018-02-06 ENCOUNTER — Encounter: Payer: Self-pay | Admitting: Maternal Newborn

## 2018-02-06 ENCOUNTER — Other Ambulatory Visit: Payer: Self-pay

## 2018-02-06 ENCOUNTER — Emergency Department
Admission: EM | Admit: 2018-02-06 | Discharge: 2018-02-06 | Disposition: A | Discharge status: Home / Self Care | Payer: Medicaid Other | Attending: Student in an Organized Health Care Education/Training Program | Admitting: Student in an Organized Health Care Education/Training Program

## 2018-02-06 ENCOUNTER — Encounter: Payer: Self-pay | Admitting: Emergency Medicine

## 2018-02-06 DIAGNOSIS — R509 Fever, unspecified: Secondary | ICD-10-CM | POA: Diagnosis not present

## 2018-02-06 DIAGNOSIS — R51 Headache: Secondary | ICD-10-CM | POA: Diagnosis not present

## 2018-02-06 DIAGNOSIS — J020 Streptococcal pharyngitis: Secondary | ICD-10-CM | POA: Insufficient documentation

## 2018-02-06 DIAGNOSIS — R109 Unspecified abdominal pain: Secondary | ICD-10-CM | POA: Diagnosis not present

## 2018-02-06 DIAGNOSIS — R070 Pain in throat: Secondary | ICD-10-CM | POA: Diagnosis present

## 2018-02-06 LAB — GROUP A STREP BY PCR: GROUP A STREP BY PCR: DETECTED — AB

## 2018-02-06 MED ORDER — AMOXICILLIN 500 MG PO TABS: 500.0000 mg | ORAL_TABLET | Freq: Two times a day (BID) | ORAL | 0 refills | Status: AC

## 2018-02-06 MED ORDER — AMOXICILLIN 500 MG PO CAPS
500.0000 mg | ORAL_CAPSULE | Freq: Once | ORAL | Status: AC
  Administered 2018-02-06: 500 mg via ORAL
  Filled 2018-02-06: qty 1

## 2018-02-06 NOTE — ED Provider Notes (Signed)
[facility] Emergency Department Provider Note  ____________________________________________  Time seen: Approximately 11:17 PM  I have reviewed the triage vital signs and the nursing notes.   HISTORY  Chief Complaint Sore Throat   Historian Mother    HPI Robin French is a 37 y.o. female presents to the emergency department with pharyngitis, headache, abdominal discomfort and anterior neck discomfort for the past 2 days.  Patient has had low-grade fever at home.  No associated rhinorrhea, congestion or nonproductive cough.  No chest pain or abdominal pain.  No alleviating measures of been attempted.   Past Medical History:  Diagnosis Date  . Depression      Immunizations up to date:  Yes.     Past Medical History:  Diagnosis Date  . Depression     Patient Active Problem List   Diagnosis Date Noted  . Supervision of normal pregnancy 12/20/2017  . Adult BMI 31.0-31.9 kg/sq m 12/20/2017    Past Surgical History:  Procedure Laterality Date  . deviated septum repair    . GANGLION CYST EXCISION    . TONSILLECTOMY AND ADENOIDECTOMY     PART ADENOID  . tubes in ears    . WISDOM TOOTH EXTRACTION     BOTTOM TWO    Prior to Admission medications   Medication Sig Start Date End Date Taking? Authorizing Provider  amoxicillin (AMOXIL) 500 MG tablet Take 1 tablet (500 mg total) by mouth 2 (two) times daily for 10 days. 02/06/18 02/16/18  Orvil Feil, PA-C  Prenat-FeFum-FePo-FA-DHA w/o A (PROVIDA DHA) 16-16-1.25-110 MG CAPS Take 1 capsule by mouth daily. 01/03/18   Nadara Mustard, MD    Allergies Patient has no known allergies.  Family History  Problem Relation Age of Onset  . Diabetes Maternal Grandmother   . Heart disease Maternal Grandfather   . Diabetes Paternal Grandmother   . Alzheimer's disease Paternal Grandmother   . Cancer Other        OVARIAN    Social History Social History   Tobacco Use  . Smoking status:  Never Smoker  . Smokeless tobacco: Never Used  Substance Use Topics  . Alcohol use: Not Currently  . Drug use: Never     Review of Systems  Constitutional: Patient has fever. Eyes:  No discharge ENT: Patient has pharyngitis. Respiratory: no cough. No SOB/ use of accessory muscles to breath Gastrointestinal:   No nausea, no vomiting.  No diarrhea.  No constipation. Musculoskeletal: Negative for musculoskeletal pain. Skin: Negative for rash, abrasions, lacerations, ecchymosis.    ____________________________________________   PHYSICAL EXAM:  VITAL SIGNS: ED Triage Vitals  Enc Vitals Group     BP 02/06/18 2153 (!) 97/59     Pulse Rate 02/06/18 2153 95     Resp 02/06/18 2153 16     Temp 02/06/18 2153 99.1 F (37.3 C)     Temp Source 02/06/18 2153 Oral     SpO2 02/06/18 2153 100 %     Weight 02/06/18 2148 184 lb (83.5 kg)     Height 02/06/18 2148 5\' 2"  (1.575 m)     Head Circumference --      Peak Flow --      Pain Score 02/06/18 2148 8     Pain Loc --      Pain Edu? --      Excl. in GC? --      Constitutional: Alert and oriented. Well appearing and in no acute distress. Eyes: Conjunctivae are  normal. PERRL. EOMI. Head: Atraumatic. ENT:      Ears:  TMs are scarred bilaterally from prior otitis media.      Nose: No congestion/rhinnorhea.      Mouth/Throat: Mucous membranes are moist.  Posterior pharynx is erythematous with tonsillectomy sites visualized. Hematological/Lymphatic/Immunilogical: Palpable cervical lymphadenopathy. Cardiovascular: Normal rate, regular rhythm. Normal S1 and S2.  Good peripheral circulation. Respiratory: Normal respiratory effort without tachypnea or retractions. Lungs CTAB. Good air entry to the bases with no decreased or absent breath sounds Musculoskeletal: Full range of motion to all extremities. No obvious deformities noted Neurologic:  Normal for age. No gross focal neurologic deficits are appreciated.  Skin:  Skin is warm, dry and  intact. No rash noted.  ____________________________________________   LABS (all labs ordered are listed, but only abnormal results are displayed)  Labs Reviewed  GROUP A STREP BY PCR - Abnormal; Notable for the following components:      Result Value   Group A Strep by PCR DETECTED (*)    All other components within normal limits   ____________________________________________  EKG   ____________________________________________  RADIOLOGY   No results found.  ____________________________________________    PROCEDURES  Procedure(s) performed:     Procedures     Medications  amoxicillin (AMOXIL) capsule 500 mg (has no administration in time range)     ____________________________________________   INITIAL IMPRESSION / ASSESSMENT AND PLAN / ED COURSE  Pertinent labs & imaging results that were available during my care of the patient were reviewed by me and considered in my medical decision making (see chart for details).     Assessment and plan Strep pharyngitis Patient presents to the emergency department with headache, low-grade fever, pharyngitis, tender cervical lymphadenopathy and absence of cough.  Patient tested positive for group A strep in the emergency department.  She was treated with her first dose of amoxicillin in the emergency department.  She was discharged with amoxicillin.  Patient was advised to change her toothbrush after finishing amoxicillin.  Vital signs were reassuring prior to discharge.  All patient questions were answered.     ____________________________________________  FINAL CLINICAL IMPRESSION(S) / ED DIAGNOSES  Final diagnoses:  Strep throat      NEW MEDICATIONS STARTED DURING THIS VISIT:  ED Discharge Orders        Ordered    amoxicillin (AMOXIL) 500 MG tablet  2 times daily     02/06/18 2315          This chart was dictated using voice recognition software/Dragon. Despite best efforts to proofread,  errors can occur which can change the meaning. Any change was purely unintentional.     Orvil FeilWoods, Jayel Scaduto M, PA-C 02/06/18 2320    Willy Eddyobinson, Patrick, MD 02/06/18 [phone]

## 2018-02-06 NOTE — ED Notes (Signed)
Patient c/o sore throat X 2 days. Patient's throat is reddened on inspection.

## 2018-02-06 NOTE — ED Triage Notes (Signed)
Patient ambulatory to triage with steady gait, without difficulty or distress noted; pt reports sore throat since yesterday with body aches

## 2018-02-07 NOTE — Telephone Encounter (Signed)
Patient is calling for results

## 2018-02-09 LAB — MATERNIT 21 PLUS CORE, BLOOD
CHROMOSOME 13: NEGATIVE
CHROMOSOME 18: NEGATIVE
CHROMOSOME 21: NEGATIVE
Y CHROMOSOME: DETECTED

## 2018-02-10 NOTE — Telephone Encounter (Signed)
Returned call but uncertain if the number is correct and did not want to leave patient results on machine in case it is not. Sent through [employer], happy to discuss if Noralyn PickCarroll calls back.

## 2018-02-11 ENCOUNTER — Emergency Department
Admission: EM | Admit: 2018-02-11 | Discharge: 2018-02-11 | Disposition: A | Discharge status: Home / Self Care | Payer: Medicaid Other | Attending: Emergency Medicine | Admitting: Emergency Medicine

## 2018-02-11 ENCOUNTER — Emergency Department: Payer: Medicaid Other

## 2018-02-11 ENCOUNTER — Other Ambulatory Visit: Payer: Self-pay

## 2018-02-11 DIAGNOSIS — Z634 Disappearance and death of family member: Secondary | ICD-10-CM | POA: Diagnosis not present

## 2018-02-11 DIAGNOSIS — O09521 Supervision of elderly multigravida, first trimester: Secondary | ICD-10-CM | POA: Diagnosis not present

## 2018-02-11 DIAGNOSIS — O200 Threatened abortion: Secondary | ICD-10-CM | POA: Diagnosis not present

## 2018-02-11 DIAGNOSIS — O209 Hemorrhage in early pregnancy, unspecified: Secondary | ICD-10-CM | POA: Diagnosis not present

## 2018-02-11 DIAGNOSIS — B373 Candidiasis of vulva and vagina: Secondary | ICD-10-CM

## 2018-02-11 DIAGNOSIS — Z3A13 13 weeks gestation of pregnancy: Secondary | ICD-10-CM | POA: Insufficient documentation

## 2018-02-11 DIAGNOSIS — R1030 Lower abdominal pain, unspecified: Secondary | ICD-10-CM | POA: Diagnosis present

## 2018-02-11 DIAGNOSIS — O9989 Other specified diseases and conditions complicating pregnancy, childbirth and the puerperium: Secondary | ICD-10-CM | POA: Diagnosis not present

## 2018-02-11 DIAGNOSIS — O23591 Infection of other part of genital tract in pregnancy, first trimester: Secondary | ICD-10-CM | POA: Insufficient documentation

## 2018-02-11 DIAGNOSIS — B3731 Acute candidiasis of vulva and vagina: Secondary | ICD-10-CM

## 2018-02-11 LAB — URINALYSIS, COMPLETE (UACMP) WITH MICROSCOPIC
Bilirubin Urine: NEGATIVE
Glucose, UA: NEGATIVE mg/dL
Ketones, ur: NEGATIVE mg/dL
Nitrite: NEGATIVE
PH: 5 (ref 5.0–8.0)
Protein, ur: NEGATIVE mg/dL
SPECIFIC GRAVITY, URINE: 1.02 (ref 1.005–1.030)

## 2018-02-11 LAB — COMPREHENSIVE METABOLIC PANEL
ALK PHOS: 51 U/L (ref 38–126)
ALT: 14 U/L (ref 0–44)
AST: 18 U/L (ref 15–41)
Albumin: 3.2 g/dL — ABNORMAL LOW (ref 3.5–5.0)
Anion gap: 6 (ref 5–15)
BILIRUBIN TOTAL: 0.3 mg/dL (ref 0.3–1.2)
BUN: 10 mg/dL (ref 6–20)
CALCIUM: 8.9 mg/dL (ref 8.9–10.3)
CO2: 26 mmol/L (ref 22–32)
CREATININE: 0.58 mg/dL (ref 0.44–1.00)
Chloride: 106 mmol/L (ref 98–111)
Glucose, Bld: 114 mg/dL — ABNORMAL HIGH (ref 70–99)
Potassium: 3.5 mmol/L (ref 3.5–5.1)
Sodium: 138 mmol/L (ref 135–145)
TOTAL PROTEIN: 7.2 g/dL (ref 6.5–8.1)

## 2018-02-11 LAB — CBC
HCT: 35.9 % (ref 35.0–47.0)
Hemoglobin: 12.2 g/dL (ref 12.0–16.0)
MCH: 30.2 pg (ref 26.0–34.0)
MCHC: 34.1 g/dL (ref 32.0–36.0)
MCV: 88.7 fL (ref 80.0–100.0)
PLATELETS: 193 10*3/uL (ref 150–440)
RBC: 4.05 MIL/uL (ref 3.80–5.20)
RDW: 13.9 % (ref 11.5–14.5)
WBC: 8.4 10*3/uL (ref 3.6–11.0)

## 2018-02-11 LAB — WET PREP, GENITAL
Clue Cells Wet Prep HPF POC: NONE SEEN
Sperm: NONE SEEN
Trich, Wet Prep: NONE SEEN

## 2018-02-11 LAB — CHLAMYDIA/NGC RT PCR (ARMC ONLY)
Chlamydia Tr: NOT DETECTED
N GONORRHOEAE: NOT DETECTED

## 2018-02-11 LAB — HCG, QUANTITATIVE, PREGNANCY: hCG, Beta Chain, Quant, S: 33625 m[IU]/mL — ABNORMAL HIGH (ref <5)

## 2018-02-11 LAB — LIPASE, BLOOD: Lipase: 42 U/L (ref 11–51)

## 2018-02-11 LAB — POCT PREGNANCY, URINE: PREG TEST UR: POSITIVE — AB

## 2018-02-11 MED ORDER — FLUCONAZOLE 100 MG PO TABS
150.0000 mg | ORAL_TABLET | Freq: Once | ORAL | Status: AC
  Administered 2018-02-11: 150 mg via ORAL
  Filled 2018-02-11: qty 1

## 2018-02-11 MED ORDER — CEPHALEXIN 500 MG PO CAPS: 500.0000 mg | ORAL_CAPSULE | Freq: Once | ORAL | Status: DC

## 2018-02-11 NOTE — ED Notes (Signed)
Patient transported to Ultrasound 

## 2018-02-11 NOTE — ED Triage Notes (Addendum)
Pt arrives to ED via POV with c/o bilateral lower abdominal pain and vaginal bleeding. Pt reports abdominal pain x "a few days" and "bleeding that started about an hour ago". Pt denies any ROM or loss of amniotic fluids; pt reports she hasn't "saturated a pad yet". Pt is A&O, in NAD; RR even regular, and unlabored; skin color/temp is WNL.

## 2018-02-11 NOTE — ED Notes (Signed)

## 2018-02-11 NOTE — Discharge Instructions (Addendum)
1.  Continue and finish your antibiotic.  You will be notified of any positive urine culture which requires us to change your antibiotic. 2.  Do not use tampons, douche or have sexual intercourse until seen by your doctor. 3.  Return to the ER for worsening symptoms, persistent vomiting, soaking more than 1 pad per hour, fainting or other concerns.

## 2018-02-11 NOTE — ED Provider Notes (Signed)
[facility] Emergency Department Provider Note   ____________________________________________   First MD Initiated Contact with Patient 02/11/18 [phone]     (approximate)  I have reviewed the triage vital signs and the nursing notes.   HISTORY  Chief Complaint Abdominal Pain and Vaginal Bleeding    HPI Robin French is a 37 y.o. female G5, P4 approximately [redacted] weeks pregnant who presents with abdominal pain and vaginal spotting.  Patient was seen in the ED and started on amoxicillin a few days ago for strep throat.  States she has had 2 deaths in the family since so her stomach has been "tore up" with cramping and diarrhea for the past few days.  Last sexual intercourse 2 days ago.  Reports vaginal spotting which began approximately 11 PM.  Denies associated dizziness, chest pain, shortness of breath, nausea, vomiting, dysuria or diarrhea.  Denies recent travel or trauma.  Tells me she had a subchorionic hemorrhage on a 6-week ultrasound at her OB/GYN's office but it had resolved on a repeat ultrasound done at the ninth week.  Past Medical History:  Diagnosis Date  . Depression     Patient Active Problem List   Diagnosis Date Noted  . Supervision of normal pregnancy 12/20/2017  . Adult BMI 31.0-31.9 kg/sq m 12/20/2017    Past Surgical History:  Procedure Laterality Date  . deviated septum repair    . GANGLION CYST EXCISION    . TONSILLECTOMY AND ADENOIDECTOMY     PART ADENOID  . tubes in ears    . WISDOM TOOTH EXTRACTION     BOTTOM TWO    Prior to Admission medications   Medication Sig Start Date End Date Taking? Authorizing Provider  amoxicillin (AMOXIL) 500 MG tablet Take 1 tablet (500 mg total) by mouth 2 (two) times daily for 10 days. 02/06/18 02/16/18  Orvil Feil, PA-C  Prenat-FeFum-FePo-FA-DHA w/o A (PROVIDA DHA) 16-16-1.25-110 MG CAPS Take 1 capsule by mouth daily. 01/03/18   Nadara Mustard, MD    Allergies Patient has no  known allergies.  Family History  Problem Relation Age of Onset  . Diabetes Maternal Grandmother   . Heart disease Maternal Grandfather   . Diabetes Paternal Grandmother   . Alzheimer's disease Paternal Grandmother   . Cancer Other        OVARIAN    Social History Social History   Tobacco Use  . Smoking status: Never Smoker  . Smokeless tobacco: Never Used  Substance Use Topics  . Alcohol use: Not Currently  . Drug use: Never    Review of Systems  Constitutional: No fever/chills Eyes: No visual changes. ENT: No sore throat. Cardiovascular: Denies chest pain. Respiratory: Denies shortness of breath. Gastrointestinal: Positive for pelvic cramping.  No abdominal pain.  No nausea, no vomiting.  No diarrhea.  No constipation. Genitourinary: Positive for vaginal spotting.  Negative for dysuria. Musculoskeletal: Negative for back pain. Skin: Negative for rash. Neurological: Negative for headaches, focal weakness or numbness.   ____________________________________________   PHYSICAL EXAM:  VITAL SIGNS: ED Triage Vitals  Enc Vitals Group     BP 02/11/18 0039 (!) 107/56     Pulse Rate 02/11/18 0039 82     Resp 02/11/18 0039 18     Temp 02/11/18 0039 98.6 F (37 C)     Temp Source 02/11/18 0039 Oral     SpO2 02/11/18 0039 99 %     Weight 02/11/18 0040 184 lb (83.5 kg)  Height 02/11/18 0040 5\' 2"  (1.575 m)     Head Circumference --      Peak Flow --      Pain Score 02/11/18 0040 4     Pain Loc --      Pain Edu? --      Excl. in GC? --     Constitutional: Alert and oriented. Well appearing and in no acute distress. Eyes: Conjunctivae are normal. PERRL. EOMI. Head: Atraumatic. Nose: No congestion/rhinnorhea. Mouth/Throat: Mucous membranes are moist.  Oropharynx non-erythematous. Neck: No stridor.   Cardiovascular: Normal rate, regular rhythm. Grossly normal heart sounds.  Good peripheral circulation. Respiratory: Normal respiratory effort.  No retractions.  Lungs CTAB. Gastrointestinal: Soft and nontender to light or deep palpation. No distention. No abdominal bruits. No CVA tenderness. Musculoskeletal: No lower extremity tenderness nor edema.  No joint effusions. Neurologic:  Normal speech and language. No gross focal neurologic deficits are appreciated. No gait instability. Skin:  Skin is warm, dry and intact. No rash noted. Psychiatric: Mood and affect are normal. Speech and behavior are normal.  ____________________________________________   LABS (all labs ordered are listed, but only abnormal results are displayed)  Labs Reviewed  WET PREP, GENITAL - Abnormal; Notable for the following components:      Result Value   Yeast Wet Prep HPF POC PRESENT (*)    WBC, Wet Prep HPF POC FEW (*)    All other components within normal limits  COMPREHENSIVE METABOLIC PANEL - Abnormal; Notable for the following components:   Glucose, Bld 114 (*)    Albumin 3.2 (*)    All other components within normal limits  URINALYSIS, COMPLETE (UACMP) WITH MICROSCOPIC - Abnormal; Notable for the following components:   Color, Urine YELLOW (*)    APPearance CLOUDY (*)    Hgb urine dipstick LARGE (*)    Leukocytes, UA MODERATE (*)    Bacteria, UA MANY (*)    All other components within normal limits  HCG, QUANTITATIVE, PREGNANCY - Abnormal; Notable for the following components:   hCG, Beta Chain, Quant, S 33,625 (*)    All other components within normal limits  POCT PREGNANCY, URINE - Abnormal; Notable for the following components:   Preg Test, Ur POSITIVE (*)    All other components within normal limits  CHLAMYDIA/NGC RT PCR ([facility] ONLY)  URINE CULTURE  LIPASE, BLOOD  CBC  POC URINE PREG, ED   ____________________________________________  EKG  None ____________________________________________  RADIOLOGY  ED MD interpretation: 13-week 3-day IUP  Official radiology report(s): [place] Ob Comp Less 14 Wks  Result Date: 02/11/2018 CLINICAL  DATA:  Vaginal bleeding for 4 hours. Lower abdominal pain for several days. Positive urine pregnancy test. Estimated gestational age by LMP is 14 weeks 1 day. EXAM: OBSTETRIC <14 WK ULTRASOUND TECHNIQUE: Transabdominal ultrasound was performed for evaluation of the gestation as well as the maternal uterus and adnexal regions. COMPARISON:  12/28/2017 FINDINGS: Intrauterine gestational sac: A single intrauterine pregnancy is identified. Yolk sac: Yolk sac is not visualized, consistent with gestational age. Embryo:  Fetal pole is present. Cardiac Activity: Fetal cardiac activity is observed. Heart Rate: 158 bpm CRL: 72.5 mm   13 w 3 d                  US EDC: 08/16/2018 Subchorionic hemorrhage:  None. Maternal uterus/adnexae: Uterus is anteverted. No myometrial mass lesions. The ovaries are visualized and appear normal. No abnormal adnexal masses. No free fluid. IMPRESSION: Single intrauterine pregnancy. Estimated gestational age  by crown-rump length is 13 weeks 3 days, representing appropriate interval growth since the previous study. No acute complication is suggested sonographically. Electronically Signed   By: Burman Nieves M.D.   On: 02/11/2018 04:56    ____________________________________________   PROCEDURES  Procedure(s) performed:   Pelvic exam: External exam within normal limits without rashes, lesions or vesicles.  Speculum exam reveals minimal vaginal bleeding.  Cervical os is closed.  Bimanual exam unremarkable..  Procedures  Critical Care performed: No  ____________________________________________   INITIAL IMPRESSION / ASSESSMENT AND PLAN / ED COURSE  As part of my medical decision making, I reviewed the following data within the electronic MEDICAL RECORD NUMBER Nursing notes reviewed and incorporated, Labs reviewed, Old chart reviewed, Radiograph reviewed  and Notes from prior ED visits   37 year old female approximately [redacted] weeks pregnant who presents with pelvic cramping and  vaginal spotting. Differential diagnosis includes, but is not limited to, ovarian cyst, ovarian torsion, acute appendicitis, diverticulitis, urinary tract infection/pyelonephritis, endometriosis, bowel obstruction, colitis, renal colic, gastroenteritis, hernia, fibroids, endometriosis, pregnancy related pain including ectopic pregnancy, etc.  I have personally reviewed patient's chart and note her ultrasound at 9 weeks and her O+ blood type.  She is currently on amoxicillin for strep throat.  This should also cover her UTI.  We will add urine culture.  Will proceed with pelvic ultrasound.  Clinical Course as of Feb 12 604  Tue Feb 11, 2018  0601 Updated patient on ultrasound results.  Will give single dose Diflucan 150 mg orally for a yeast infection.  Pelvic rest precautions given.  Strict return precautions given.  Patient verbalizes understanding and agrees with plan of care.   [JS]    Clinical Course User Index [JS] Irean Hong, MD     ____________________________________________   FINAL CLINICAL IMPRESSION(S) / ED DIAGNOSES  Final diagnoses:  Threatened miscarriage  Yeast vaginitis     ED Discharge Orders    None       Note:  This document was prepared using Dragon voice recognition software and may include unintentional dictation errors.    Irean Hong, MD 02/11/18 [phone]

## 2018-02-12 LAB — URINE CULTURE

## 2018-02-14 ENCOUNTER — Ambulatory Visit (INDEPENDENT_AMBULATORY_CARE_PROVIDER_SITE_OTHER): Payer: Medicaid Other | Admitting: Advanced Practice Midwife

## 2018-02-14 ENCOUNTER — Encounter: Payer: Self-pay | Admitting: Advanced Practice Midwife

## 2018-02-14 VITALS — BP 104/58 | Wt 186.0 lb

## 2018-02-14 DIAGNOSIS — Z3A13 13 weeks gestation of pregnancy: Secondary | ICD-10-CM

## 2018-02-14 DIAGNOSIS — Z3481 Encounter for supervision of other normal pregnancy, first trimester: Secondary | ICD-10-CM

## 2018-02-14 NOTE — Progress Notes (Signed)
  Routine Prenatal Care Visit  Subjective  Robin SolianCarroll K Kenley French is a 37 y.o. [phone] at 6652w4d being seen today for ongoing prenatal care.  She is currently monitored for the following issues for this low-risk pregnancy and has Supervision of normal pregnancy and Adult BMI 31.0-31.9 kg/sq m on their problem list.  ----------------------------------------------------------------------------------- Patient reports some persistent vaginal itching following Diflucan 3 days ago. She will try OTC monistat. She has not had any further bleeding since she was seen in the ED 3 days ago. .    Lockie Pares. Vag. Bleeding: None.   . Denies leaking of fluid.  ----------------------------------------------------------------------------------- The following portions of the patient's history were reviewed and updated as appropriate: allergies, current medications, past family history, past medical history, past social history, past surgical history and problem list. Problem list updated.   Objective  Blood pressure (!) 104/58, weight 186 lb (84.4 kg), last menstrual period 11/04/2017. Pregravid weight 175 lb (79.4 kg) Total Weight Gain 11 lb (4.99 kg) Urinalysis: Urine Protein: Negative Urine Glucose: Negative  Fetal Status: Fetal Heart Rate (bpm): 155         General:  Alert, oriented and cooperative. Patient is in no acute distress.  Skin: Skin is warm and dry. No rash noted.   Cardiovascular: Normal heart rate noted  Respiratory: Normal respiratory effort, no problems with respiration noted  Abdomen: Soft, gravid, appropriate for gestational age. Pain/Pressure: Absent     Pelvic:  Cervical exam deferred        Extremities: Normal range of motion.     Mental Status: Normal mood and affect. Normal behavior. Normal judgment and thought content.   Assessment   37 y.o. [id number] at 5952w4d by  08/18/2018, by Ultrasound presenting for routine prenatal visit  Plan   FIFTH Problems (from 12/20/17 to present)    Problem  Noted Resolved   Supervision of normal pregnancy 12/20/2017 by Oswaldo ConroySchmid, Jacelyn Y, CNM No   Overview Addendum 12/29/2017  5:29 PM by Oswaldo ConroySchmid, Jacelyn Y, CNM    Clinic [facility] Prenatal Labs  Dating 6w 1d US Blood type: O/Positive/-- (06/04 1157)   Genetic Screen 1 Screen:    AFP:     Quad:     NIPS: Antibody:Negative (06/04 1157)  Anatomic US  Rubella: 1.02 (06/04 1157) Varicella: Immune  GTT Early: 105              Third trimester:  RPR: Non Reactive (06/04 1157)   Rhogam  HBsAg: Negative (06/04 1157)   TDaP vaccine                       Flu Shot: HIV: Non Reactive (06/04 1157)   Baby Food                                GBS:   Contraception  Pap: 2014 NILM/HPV negative, declines until PP  CBB     CS/VBAC    Support Person                  Preterm labor symptoms and general obstetric precautions including but not limited to vaginal bleeding, contractions, leaking of fluid and fetal movement were reviewed in detail with the patient. Please refer to After Visit Summary for other counseling recommendations.   Return for has follow up scheduled.  Tresea MallJane Yizel Canby, CNM 02/14/2018 11:50 AM

## 2018-02-14 NOTE — Progress Notes (Signed)
ROB ER follow up 

## 2018-02-14 NOTE — Patient Instructions (Signed)

## 2018-02-27 ENCOUNTER — Encounter: Payer: Self-pay | Admitting: Maternal Newborn

## 2018-02-27 ENCOUNTER — Ambulatory Visit (INDEPENDENT_AMBULATORY_CARE_PROVIDER_SITE_OTHER): Payer: Medicaid Other | Admitting: Maternal Newborn

## 2018-02-27 VITALS — BP 100/60 | Wt 186.0 lb

## 2018-02-27 DIAGNOSIS — Z3481 Encounter for supervision of other normal pregnancy, first trimester: Secondary | ICD-10-CM

## 2018-02-27 DIAGNOSIS — Z3A15 15 weeks gestation of pregnancy: Secondary | ICD-10-CM

## 2018-02-27 DIAGNOSIS — Z3689 Encounter for other specified antenatal screening: Secondary | ICD-10-CM

## 2018-02-27 DIAGNOSIS — Z3482 Encounter for supervision of other normal pregnancy, second trimester: Secondary | ICD-10-CM

## 2018-02-27 LAB — POCT URINALYSIS DIPSTICK OB
Glucose, UA: NEGATIVE — AB
PROTEIN: NEGATIVE

## 2018-02-27 NOTE — Patient Instructions (Signed)

## 2018-02-27 NOTE — Progress Notes (Signed)
    Routine Prenatal Care Visit  Subjective  Robin French is a 37 y.o. [phone] at 6419w3d being seen today for ongoing prenatal care.  She is currently monitored for the following issues for this low-risk pregnancy and has Supervision of normal pregnancy and Adult BMI 31.0-31.9 kg/sq m on their problem list.  ----------------------------------------------------------------------------------- Patient reports some stretching sensations/pains and feeling that baby is over on one side of her womb. Contractions: Not present. Vag. Bleeding: None.  Movement: Present. No leaking of fluid.  ----------------------------------------------------------------------------------- The following portions of the patient's history were reviewed and updated as appropriate: allergies, current medications, past family history, past medical history, past social history, past surgical history and problem list. Problem list updated.  Objective  Blood pressure 100/60, weight 186 lb (84.4 kg), last menstrual period 11/04/2017. Pregravid weight 175 lb (79.4 kg) Total Weight Gain 11 lb (4.99 kg) Urinalysis: Protein Negative; Glucose Negative Fetal Status: Fetal Heart Rate (bpm): 158   Movement: Present     General:  Alert, oriented and cooperative. Patient is in no acute distress.  Skin: Skin is warm and dry. No rash noted.   Cardiovascular: Normal heart rate noted  Respiratory: Normal respiratory effort, no problems with respiration noted  Abdomen: Soft, gravid, appropriate for gestational age. Pain/Pressure: Absent     Pelvic:  Cervical exam deferred        Extremities: Normal range of motion.     Mental Status: Normal mood and affect. Normal behavior. Normal judgment and thought content.    Assessment   37 y.o. [id number] at 7119w3d, EDD 08/18/2018 by Ultrasound presenting for routine prenatal visit.  Plan   FIFTH Problems (from 12/20/17 to present)    Problem Noted Resolved   Supervision of normal  pregnancy 12/20/2017 by Oswaldo ConroySchmid, Jevante Hollibaugh Y, CNM No   Overview Addendum 12/29/2017  5:29 PM by Oswaldo ConroySchmid, Evans Levee Y, CNM    Clinic [facility] Prenatal Labs  Dating 6w 1d US Blood type: O/Positive/-- (06/04 1157)   Genetic Screen 1 Screen:    AFP:     Quad:     NIPS: Antibody:Negative (06/04 1157)  Anatomic US  Rubella: 1.02 (06/04 1157) Varicella: Immune  GTT Early: 105              Third trimester:  RPR: Non Reactive (06/04 1157)   Rhogam  HBsAg: Negative (06/04 1157)   TDaP vaccine                       Flu Shot: HIV: Non Reactive (06/04 1157)   Baby Food                                GBS:   Contraception  Pap: 2014 NILM/HPV negative, declines until PP  CBB     CS/VBAC    Support Person                 Gestational age appropriate obstetric precautions were reviewed. Please refer to After Visit Summary for other counseling recommendations.   Return in about 4 weeks (around 03/27/2018) for ROB and anatomy scan.  Robin BruinsJacelyn Mahira French, CNM 02/27/2018  4:33 PM

## 2018-02-27 NOTE — Progress Notes (Signed)
No concerns.rj 

## 2018-03-27 ENCOUNTER — Ambulatory Visit (INDEPENDENT_AMBULATORY_CARE_PROVIDER_SITE_OTHER): Payer: Medicaid Other | Admitting: Advanced Practice Midwife

## 2018-03-27 ENCOUNTER — Ambulatory Visit (INDEPENDENT_AMBULATORY_CARE_PROVIDER_SITE_OTHER): Payer: Medicaid Other

## 2018-03-27 ENCOUNTER — Encounter: Payer: Self-pay | Admitting: Advanced Practice Midwife

## 2018-03-27 VITALS — BP 110/60 | Wt 193.0 lb

## 2018-03-27 DIAGNOSIS — Z3481 Encounter for supervision of other normal pregnancy, first trimester: Secondary | ICD-10-CM

## 2018-03-27 DIAGNOSIS — Z3482 Encounter for supervision of other normal pregnancy, second trimester: Secondary | ICD-10-CM

## 2018-03-27 DIAGNOSIS — Z3480 Encounter for supervision of other normal pregnancy, unspecified trimester: Secondary | ICD-10-CM | POA: Diagnosis not present

## 2018-03-27 DIAGNOSIS — Z3689 Encounter for other specified antenatal screening: Secondary | ICD-10-CM

## 2018-03-27 DIAGNOSIS — Z363 Encounter for antenatal screening for malformations: Secondary | ICD-10-CM

## 2018-03-27 DIAGNOSIS — Z3A19 19 weeks gestation of pregnancy: Secondary | ICD-10-CM

## 2018-03-27 LAB — POCT URINALYSIS DIPSTICK OB
Glucose, UA: NEGATIVE
PROTEIN: NEGATIVE

## 2018-03-27 NOTE — Progress Notes (Signed)
ROB Anatomy scan/ IT IS A BOY!!

## 2018-03-27 NOTE — Progress Notes (Signed)
  Routine Prenatal Care Visit  Subjective  Robin French is a 36 y.o. [phone] at [redacted]w[redacted]d being seen today for ongoing prenatal care.  She is currently monitored for the following issues for this high-risk pregnancy and has Supervision of normal pregnancy and Adult BMI 31.0-31.9 kg/sq m on their problem list.  ----------------------------------------------------------------------------------- Patient reports no complaints.   Contractions: Not present. Vag. Bleeding: None.  Movement: Present. Denies leaking of fluid.  ----------------------------------------------------------------------------------- The following portions of the patient's history were reviewed and updated as appropriate: allergies, current medications, past family history, past medical history, past social history, past surgical history and problem list. Problem list updated.   Objective  Blood pressure 110/60, weight 193 lb (87.5 kg), last menstrual period 11/04/2017. Pregravid weight 175 lb (79.4 kg) Total Weight Gain 18 lb (8.165 kg) Urinalysis: Urine Protein    Urine Glucose    Fetal Status: Fetal Heart Rate (bpm): 156   Movement: Present     Anatomy scan today: complete, normal, breech, female  General:  Alert, oriented and cooperative. Patient is in no acute distress.  Skin: Skin is warm and dry. No rash noted.   Cardiovascular: Normal heart rate noted  Respiratory: Normal respiratory effort, no problems with respiration noted  Abdomen: Soft, gravid, appropriate for gestational age. Pain/Pressure: Absent     Pelvic:  Cervical exam deferred        Extremities: Normal range of motion.     Mental Status: Normal mood and affect. Normal behavior. Normal judgment and thought content.   Assessment   37 y.o. [id number] at [redacted]w[redacted]d by  08/18/2018, by Ultrasound presenting for routine prenatal visit  Plan   FIFTH Problems (from 12/20/17 to present)    Problem Noted Resolved   Supervision of normal pregnancy 12/20/2017 by  Oswaldo Conroy, CNM No   Overview Addendum 12/29/2017  5:29 PM by Oswaldo Conroy, CNM    [facility] Prenatal Labs  Dating 6w 1d [place] Blood type: O/Positive/-- (06/04 1157)   Genetic Screen 1 Screen:    AFP:     Quad:     NIPS: Antibody:Negative (06/04 1157)  Anatomic [place]  Rubella: 1.02 (06/04 1157) Varicella: Immune  GTT Early: 105              Third trimester:  RPR: Non Reactive (06/04 1157)   Rhogam  HBsAg: Negative (06/04 1157)   TDaP vaccine                       Flu Shot: HIV: Non Reactive (06/04 1157)   Baby Food                                GBS:   Contraception  Pap: 2014 NILM/HPV negative, declines until PP  CBB     CS/VBAC    Support Person                  Preterm labor symptoms and general obstetric precautions including but not limited to vaginal bleeding, contractions, leaking of fluid and fetal movement were reviewed in detail with the patient.   Return in about 4 weeks (around 04/24/2018) for rob.  Tresea Mall, CNM 03/27/2018 9:52 AM

## 2018-03-27 NOTE — Addendum Note (Signed)
Addended by: [place], Zavannah Deblois B on: 03/27/2018 10:03 AM   Modules accepted: Orders

## 2018-03-29 LAB — AFP, SERUM, OPEN SPINA BIFIDA
AFP MoM: 1.55
AFP VALUE AFPOSL: 63.8 ng/mL
Gest. Age on Collection Date: 19 weeks
MATERNAL AGE AT EDD: 38.9 a
OSBR Risk 1 IN: 2393
Test Results:: NEGATIVE
Weight: 193 [lb_av]

## 2018-04-24 ENCOUNTER — Ambulatory Visit (INDEPENDENT_AMBULATORY_CARE_PROVIDER_SITE_OTHER): Payer: Medicaid Other | Admitting: Maternal Newborn

## 2018-04-24 VITALS — BP 102/60 | Wt 199.0 lb

## 2018-04-24 DIAGNOSIS — Z23 Encounter for immunization: Secondary | ICD-10-CM | POA: Diagnosis not present

## 2018-04-24 DIAGNOSIS — Z3480 Encounter for supervision of other normal pregnancy, unspecified trimester: Secondary | ICD-10-CM

## 2018-04-24 DIAGNOSIS — Z3482 Encounter for supervision of other normal pregnancy, second trimester: Secondary | ICD-10-CM

## 2018-04-24 DIAGNOSIS — Z3A23 23 weeks gestation of pregnancy: Secondary | ICD-10-CM

## 2018-04-24 LAB — POCT URINALYSIS DIPSTICK OB: GLUCOSE, UA: NEGATIVE

## 2018-04-24 NOTE — Progress Notes (Signed)
    Routine Prenatal Care Visit  Subjective  Robin French is a 37 y.o. [phone] at [redacted]w[redacted]d being seen today for ongoing prenatal care.  She is currently monitored for the following issues for this low-risk pregnancy and has Supervision of normal pregnancy and Adult BMI 31.0-31.9 kg/sq m on their problem list.  ----------------------------------------------------------------------------------- Patient reports some pelvic pain, mostly around her pubic bone.  Contractions: Not present. Vag. Bleeding: None.  Movement: Present. No leaking of fluid.  ----------------------------------------------------------------------------------- The following portions of the patient's history were reviewed and updated as appropriate: allergies, current medications, past family history, past medical history, past social history, past surgical history and problem list. Problem list updated.  Objective  Blood pressure 102/60, weight 199 lb (90.3 kg), last menstrual period 11/04/2017. Pregravid weight 175 lb (79.4 kg) Total Weight Gain 24 lb (10.9 kg) Body mass index is 36.4 kg/m.   Urinalysis: Protein Trace, Glucose Negative Fetal Status: Fetal Heart Rate (bpm): 150 Fundal Height: 24 cm Movement: Present     General:  Alert, oriented and cooperative. Patient is in no acute distress.  Skin: Skin is warm and dry. No rash noted.   Cardiovascular: Normal heart rate noted  Respiratory: Normal respiratory effort, no problems with respiration noted  Abdomen: Soft, gravid, appropriate for gestational age. Pain/Pressure: Present     Pelvic:  Cervical exam deferred        Extremities: Normal range of motion.     Mental Status: Normal mood and affect. Normal behavior. Normal judgment and thought content.     Assessment   36 y.o. [id number] at [redacted]w[redacted]d, EDD 08/18/2018 by Ultrasound presenting for a routine prenatal visit.  Plan   FIFTH Problems (from 12/20/17 to present)    Problem Noted Resolved   Supervision  of normal pregnancy 12/20/2017 by Oswaldo Conroy, CNM No   Overview Addendum 12/29/2017  5:29 PM by Oswaldo Conroy, CNM    [facility] Prenatal Labs  Dating 6w 1d [place] Blood type: O/Positive/-- (06/04 1157)   Genetic Screen 1 Screen:    AFP:     Quad:     NIPS: Antibody:Negative (06/04 1157)  Anatomic [place]  Rubella: 1.02 (06/04 1157) Varicella: Immune  GTT Early: 105              Third trimester:  RPR: Non Reactive (06/04 1157)   Rhogam  HBsAg: Negative (06/04 1157)   TDaP vaccine                       Flu Shot: HIV: Non Reactive (06/04 1157)   Baby Food                                GBS:   Contraception  Pap: 2014 NILM/HPV negative, declines until PP  CBB     CS/VBAC    Support Person                 Very concerned about size of baby, and has questions about elective Cesarean delivery. Advised her to schedule next visit with an OB to discuss risks of elective surgery.  Gestational age appropriate obstetric precautions were reviewed.  Return in about 4 weeks (around 05/22/2018) for ROB with GTT/28 week labs.  Marcelyn Bruins, CNM 04/24/2018  8:58 AM

## 2018-04-24 NOTE — Progress Notes (Signed)
ROB C/o pelvic pain  Flu shot today

## 2018-04-25 ENCOUNTER — Encounter: Payer: Self-pay | Admitting: Maternal Newborn

## 2018-05-22 ENCOUNTER — Other Ambulatory Visit: Payer: Medicaid Other

## 2018-05-22 ENCOUNTER — Ambulatory Visit (INDEPENDENT_AMBULATORY_CARE_PROVIDER_SITE_OTHER): Payer: Medicaid Other | Admitting: Obstetrics and Gynecology

## 2018-05-22 VITALS — BP 108/66 | Wt 204.0 lb

## 2018-05-22 DIAGNOSIS — Z3480 Encounter for supervision of other normal pregnancy, unspecified trimester: Secondary | ICD-10-CM

## 2018-05-22 DIAGNOSIS — O09529 Supervision of elderly multigravida, unspecified trimester: Secondary | ICD-10-CM | POA: Insufficient documentation

## 2018-05-22 DIAGNOSIS — Z3A28 28 weeks gestation of pregnancy: Secondary | ICD-10-CM

## 2018-05-22 DIAGNOSIS — O09523 Supervision of elderly multigravida, third trimester: Secondary | ICD-10-CM

## 2018-05-22 LAB — POCT URINALYSIS DIPSTICK OB: PROTEIN: NEGATIVE

## 2018-05-22 NOTE — Progress Notes (Signed)
ROB 28 week labs Tubal paper signed Foot joint pain/leg swelling

## 2018-05-22 NOTE — Progress Notes (Signed)
    Routine Prenatal Care Visit  Subjective  Robin French is a 37 y.o. [phone] at [redacted]w[redacted]d being seen today for ongoing prenatal care.  She is currently monitored for the following issues for this high-risk pregnancy and has Supervision of normal pregnancy and Adult BMI 31.0-31.9 kg/sq m on their problem list.  ----------------------------------------------------------------------------------- Patient reports no complaints.   Contractions: Not present. Vag. Bleeding: None.  Movement: Present. Denies leaking of fluid.  ----------------------------------------------------------------------------------- The following portions of the patient's history were reviewed and updated as appropriate: allergies, current medications, past family history, past medical history, past social history, past surgical history and problem list. Problem list updated.   Objective  Blood pressure 108/66, weight 204 lb (92.5 kg), last menstrual period 11/04/2017. Pregravid weight 175 lb (79.4 kg) Total Weight Gain 29 lb (13.2 kg) Urinalysis:      Fetal Status: Fetal Heart Rate (bpm): 150 Fundal Height: 28 cm Movement: Present     General:  Alert, oriented and cooperative. Patient is in no acute distress.  Skin: Skin is warm and dry. No rash noted.   Cardiovascular: Normal heart rate noted  Respiratory: Normal respiratory effort, no problems with respiration noted  Abdomen: Soft, gravid, appropriate for gestational age. Pain/Pressure: Present     Pelvic:  Cervical exam deferred        Extremities: Normal range of motion.     ental Status: Normal mood and affect. Normal behavior. Normal judgment and thought content.     Assessment   37 y.o. [id number] at [redacted]w[redacted]d by  08/18/2018, by Ultrasound presenting for routine prenatal visit  Plan   FIFTH Problems (from 12/20/17 to present)    Problem Noted Resolved   Supervision of normal pregnancy 12/20/2017 by Oswaldo Conroy, CNM No   Overview Addendum 12/29/2017   5:29 PM by Oswaldo Conroy, CNM    [facility] Prenatal Labs  Dating 6w 1d [place] Blood type: O/Positive/-- (06/04 1157)   Genetic Screen 1 Screen:    AFP:     Quad:     NIPS: Antibody:Negative (06/04 1157)  Anatomic [place]  Rubella: 1.02 (06/04 1157) Varicella: Immune  GTT Early: 105              Third trimester:  RPR: Non Reactive (06/04 1157)   Rhogam  HBsAg: Negative (06/04 1157)   TDaP vaccine                       Flu Shot: HIV: Non Reactive (06/04 1157)   Baby Food                                GBS:   Contraception  Pap: 2014 NILM/HPV negative, declines until PP  CBB     CS/VBAC    Support Person                  Gestational age appropriate obstetric precautions including but not limited to vaginal bleeding, contractions, leaking of fluid and fetal movement were reviewed in detail with the patient.    - 28 week labs today - BTL consent   Return in about 2 weeks (around 06/05/2018) for ROB.  Vena Austria, MD, [facility], [facility] 05/22/2018, 9:45 AM

## 2018-05-23 LAB — 28 WEEK RH+PANEL
BASOS ABS: 0 10*3/uL (ref 0.0–0.2)
Basos: 0 %
EOS (ABSOLUTE): 0 10*3/uL (ref 0.0–0.4)
Eos: 0 %
GESTATIONAL DIABETES SCREEN: 95 mg/dL (ref 65–139)
HIV Screen 4th Generation wRfx: NONREACTIVE
Hematocrit: 31.9 % — ABNORMAL LOW (ref 34.0–46.6)
Hemoglobin: 10.4 g/dL — ABNORMAL LOW (ref 11.1–15.9)
IMMATURE GRANULOCYTES: 1 %
Immature Grans (Abs): 0.1 10*3/uL (ref 0.0–0.1)
Lymphocytes Absolute: 1.4 10*3/uL (ref 0.7–3.1)
Lymphs: 15 %
MCH: 29.4 pg (ref 26.6–33.0)
MCHC: 32.6 g/dL (ref 31.5–35.7)
MCV: 90 fL (ref 79–97)
MONOS ABS: 0.3 10*3/uL (ref 0.1–0.9)
Monocytes: 4 %
NEUTROS PCT: 80 %
Neutrophils Absolute: 7.6 10*3/uL — ABNORMAL HIGH (ref 1.4–7.0)
Platelets: 160 10*3/uL (ref 150–450)
RBC: 3.54 x10E6/uL — AB (ref 3.77–5.28)
RDW: 12.2 % — ABNORMAL LOW (ref 12.3–15.4)
RPR: NONREACTIVE
WBC: 9.5 10*3/uL (ref 3.4–10.8)

## 2018-06-06 ENCOUNTER — Encounter: Payer: Self-pay | Admitting: Maternal Newborn

## 2018-06-06 ENCOUNTER — Ambulatory Visit (INDEPENDENT_AMBULATORY_CARE_PROVIDER_SITE_OTHER): Payer: Medicaid Other | Admitting: Maternal Newborn

## 2018-06-06 VITALS — BP 100/60 | Wt 207.0 lb

## 2018-06-06 DIAGNOSIS — O99013 Anemia complicating pregnancy, third trimester: Secondary | ICD-10-CM

## 2018-06-06 DIAGNOSIS — Z3A29 29 weeks gestation of pregnancy: Secondary | ICD-10-CM

## 2018-06-06 DIAGNOSIS — Z3480 Encounter for supervision of other normal pregnancy, unspecified trimester: Secondary | ICD-10-CM

## 2018-06-06 LAB — POCT URINALYSIS DIPSTICK OB
GLUCOSE, UA: NEGATIVE
POC,PROTEIN,UA: NEGATIVE

## 2018-06-06 MED ORDER — FERROUS SULFATE 325 (65 FE) MG PO TABS: 325.0000 mg | ORAL_TABLET | Freq: Every day | ORAL | 3 refills | Status: DC

## 2018-06-06 NOTE — Patient Instructions (Signed)
Third Trimester of Pregnancy The third trimester is from week 28 through week 40 (months 7 through 9). The third trimester is a time when the unborn baby (fetus) is growing rapidly. At the end of the ninth month, the fetus is about 20 inches in length and weighs 6-10 pounds. Body changes during your third trimester Your body will continue to go through many changes during pregnancy. The changes vary from woman to woman. During the third trimester:  Your weight will continue to increase. You can expect to gain 25-35 pounds (11-16 kg) by the end of the pregnancy.  You may begin to get stretch marks on your hips, abdomen, and breasts.  You may urinate more often because the fetus is moving lower into your pelvis and pressing on your bladder.  You may develop or continue to have heartburn. This is caused by increased hormones that slow down muscles in the digestive tract.  You may develop or continue to have constipation because increased hormones slow digestion and cause the muscles that push waste through your intestines to relax.  You may develop hemorrhoids. These are swollen veins (varicose veins) in the rectum that can itch or be painful.  You may develop swollen, bulging veins (varicose veins) in your legs.  You may have increased body aches in the pelvis, back, or thighs. This is due to weight gain and increased hormones that are relaxing your joints.  You may have changes in your hair. These can include thickening of your hair, rapid growth, and changes in texture. Some women also have hair loss during or after pregnancy, or hair that feels dry or thin. Your hair will most likely return to normal after your baby is born.  Your breasts will continue to grow and they will continue to become tender. A yellow fluid (colostrum) may leak from your breasts. This is the first milk you are producing for your baby.  Your belly button may stick out.  You may notice more swelling in your hands,  face, or ankles.  You may have increased tingling or numbness in your hands, arms, and legs. The skin on your belly may also feel numb.  You may feel short of breath because of your expanding uterus.  You may have more problems sleeping. This can be caused by the size of your belly, increased need to urinate, and an increase in your body's metabolism.  You may notice the fetus "dropping," or moving lower in your abdomen (lightening).  You may have increased vaginal discharge.  You may notice your joints feel loose and you may have pain around your pelvic bone.  What to expect at prenatal visits You will have prenatal exams every 2 weeks until week 36. Then you will have weekly prenatal exams. During a routine prenatal visit:  You will be weighed to make sure you and the baby are growing normally.  Your blood pressure will be taken.  Your abdomen will be measured to track your baby's growth.  The fetal heartbeat will be listened to.  Any test results from the previous visit will be discussed.  You may have a cervical check near your due date to see if your cervix has softened or thinned (effaced).  You will be tested for Group B streptococcus. This happens between 35 and 37 weeks.  Your health care provider may ask you:  What your birth plan is.  How you are feeling.  If you are feeling the baby move.  If you have had   any abnormal symptoms, such as leaking fluid, bleeding, severe headaches, or abdominal cramping.  If you are using any tobacco products, including cigarettes, chewing tobacco, and electronic cigarettes.  If you have any questions.  Other tests or screenings that may be performed during your third trimester include:  Blood tests that check for low iron levels (anemia).  Fetal testing to check the health, activity level, and growth of the fetus. Testing is done if you have certain medical conditions or if there are problems during the  pregnancy.  Nonstress test (NST). This test checks the health of your baby to make sure there are no signs of problems, such as the baby not getting enough oxygen. During this test, a belt is placed around your belly. The baby is made to move, and its heart rate is monitored during movement.  What is false labor? False labor is a condition in which you feel small, irregular tightenings of the muscles in the womb (contractions) that usually go away with rest, changing position, or drinking water. These are called Braxton Hicks contractions. Contractions may last for hours, days, or even weeks before true labor sets in. If contractions come at regular intervals, become more frequent, increase in intensity, or become painful, you should see your health care provider. What are the signs of labor?  Abdominal cramps.  Regular contractions that start at 10 minutes apart and become stronger and more frequent with time.  Contractions that start on the top of the uterus and spread down to the lower abdomen and back.  Increased pelvic pressure and dull back pain.  A watery or bloody mucus discharge that comes from the vagina.  Leaking of amniotic fluid. This is also known as your "water breaking." It could be a slow trickle or a gush. Let your health care provider know if it has a color or strange odor. If you have any of these signs, call your health care provider right away, even if it is before your due date. Follow these instructions at home: Medicines  Follow your health care provider's instructions regarding medicine use. Specific medicines may be either safe or unsafe to take during pregnancy.  Take a prenatal vitamin that contains at least 600 micrograms (mcg) of folic acid.  If you develop constipation, try taking a stool softener if your health care provider approves. Eating and drinking  Eat a balanced diet that includes fresh fruits and vegetables, whole grains, good sources of protein  such as meat, eggs, or tofu, and low-fat dairy. Your health care provider will help you determine the amount of weight gain that is right for you.  Avoid raw meat and uncooked cheese. These carry germs that can cause birth defects in the baby.  If you have low calcium intake from food, talk to your health care provider about whether you should take a daily calcium supplement.  Eat four or five small meals rather than three large meals a day.  Limit foods that are high in fat and processed sugars, such as fried and sweet foods.  To prevent constipation: ? Drink enough fluid to keep your urine clear or pale yellow. ? Eat foods that are high in fiber, such as fresh fruits and vegetables, whole grains, and beans. Activity  Exercise only as directed by your health care provider. Most women can continue their usual exercise routine during pregnancy. Try to exercise for 30 minutes at least 5 days a week. Stop exercising if you experience uterine contractions.  Avoid heavy   lifting.  Do not exercise in extreme heat or humidity, or at high altitudes.  Wear low-heel, comfortable shoes.  Practice good posture.  You may continue to have sex unless your health care provider tells you otherwise. Relieving pain and discomfort  Take frequent breaks and rest with your legs elevated if you have leg cramps or low back pain.  Take warm sitz baths to soothe any pain or discomfort caused by hemorrhoids. Use hemorrhoid cream if your health care provider approves.  Wear a good support bra to prevent discomfort from breast tenderness.  If you develop varicose veins: ? Wear support pantyhose or compression stockings as told by your healthcare provider. ? Elevate your feet for 15 minutes, 3-4 times a day. Prenatal care  Write down your questions. Take them to your prenatal visits.  Keep all your prenatal visits as told by your health care provider. This is important. Safety  Wear your seat belt at  all times when driving.  Make a list of emergency phone numbers, including numbers for family, friends, the hospital, and police and fire departments. General instructions  Avoid cat litter boxes and soil used by cats. These carry germs that can cause birth defects in the baby. If you have a cat, ask someone to clean the litter box for you.  Do not travel far distances unless it is absolutely necessary and only with the approval of your health care provider.  Do not use hot tubs, steam rooms, or saunas.  Do not drink alcohol.  Do not use any products that contain nicotine or tobacco, such as cigarettes and e-cigarettes. If you need help quitting, ask your health care provider.  Do not use any medicinal herbs or unprescribed drugs. These chemicals affect the formation and growth of the baby.  Do not douche or use tampons or scented sanitary pads.  Do not cross your legs for long periods of time.  To prepare for the arrival of your baby: ? Take prenatal classes to understand, practice, and ask questions about labor and delivery. ? Make a trial run to the hospital. ? Visit the hospital and tour the maternity area. ? Arrange for maternity or paternity leave through employers. ? Arrange for family and friends to take care of pets while you are in the hospital. ? Purchase a rear-facing car seat and make sure you know how to install it in your car. ? Pack your hospital bag. ? Prepare the baby's nursery. Make sure to remove all pillows and stuffed animals from the baby's crib to prevent suffocation.  Visit your dentist if you have not gone during your pregnancy. Use a soft toothbrush to brush your teeth and be gentle when you floss. Contact a health care provider if:  You are unsure if you are in labor or if your water has broken.  You become dizzy.  You have mild pelvic cramps, pelvic pressure, or nagging pain in your abdominal area.  You have lower back pain.  You have persistent  nausea, vomiting, or diarrhea.  You have an unusual or bad smelling vaginal discharge.  You have pain when you urinate. Get help right away if:  Your water breaks before 37 weeks.  You have regular contractions less than 5 minutes apart before 37 weeks.  You have a fever.  You are leaking fluid from your vagina.  You have spotting or bleeding from your vagina.  You have severe abdominal pain or cramping.  You have rapid weight loss or weight gain.    You have shortness of breath with chest pain.  You notice sudden or extreme swelling of your face, hands, ankles, feet, or legs.  Your baby makes fewer than 10 movements in 2 hours.  You have severe headaches that do not go away when you take medicine.  You have vision changes. Summary  The third trimester is from week 28 through week 40, months 7 through 9. The third trimester is a time when the unborn baby (fetus) is growing rapidly.  During the third trimester, your discomfort may increase as you and your baby continue to gain weight. You may have abdominal, leg, and back pain, sleeping problems, and an increased need to urinate.  During the third trimester your breasts will keep growing and they will continue to become tender. A yellow fluid (colostrum) may leak from your breasts. This is the first milk you are producing for your baby.  False labor is a condition in which you feel small, irregular tightenings of the muscles in the womb (contractions) that eventually go away. These are called Braxton Hicks contractions. Contractions may last for hours, days, or even weeks before true labor sets in.  Signs of labor can include: abdominal cramps; regular contractions that start at 10 minutes apart and become stronger and more frequent with time; watery or bloody mucus discharge that comes from the vagina; increased pelvic pressure and dull back pain; and leaking of amniotic fluid. This information is not intended to replace advice  given to you by your health care provider. Make sure you discuss any questions you have with your health care provider. Document Released: 07/03/2001 Document Revised: 12/15/2015 Document Reviewed: 09/09/2012 [employer]vier Interactive Patient Education  2017 [employer]  

## 2018-06-06 NOTE — Progress Notes (Signed)
ROB- no concerns 

## 2018-06-06 NOTE — Progress Notes (Addendum)
Routine Prenatal Care Visit  Subjective  Robin French is a 37 y.o. [phone] at 6588w4d being seen today for ongoing prenatal care.  She is currently monitored for the following issues for this low-risk pregnancy and has Supervision of normal pregnancy; Adult BMI 31.0-31.9 kg/sq m; and Advanced maternal age in multigravida on their problem list.  ----------------------------------------------------------------------------------- Patient reports URI symptoms: nasal congestion, cough, sore throat. Contractions: Not present. Vag. Bleeding: None.  Movement: Present. No leaking of fluid.  ----------------------------------------------------------------------------------- The following portions of the patient's history were reviewed and updated as appropriate: allergies, current medications, past family history, past medical history, past social history, past surgical history and problem list. Problem list updated.  Objective  Blood pressure 100/60, weight 207 lb (93.9 kg), last menstrual period 11/04/2017. Pregravid weight 175 lb (79.4 kg) Total Weight Gain 32 lb (14.5 kg) Body mass index is 37.86 kg/m.   Urinalysis:   Protein Negative, Glucose Negative  Fetal Status: Fetal Heart Rate (bpm): 153 Fundal Height: 30 cm Movement: Present     General:  Alert, oriented and cooperative. Patient is in no acute distress.  Skin: Skin is warm and dry. No rash noted.   Cardiovascular: Normal heart rate noted  Respiratory: Normal respiratory effort, no problems with respiration noted  Abdomen: Soft, gravid, appropriate for gestational age. Pain/Pressure: Present     Pelvic:  Cervical exam deferred        Extremities: Normal range of motion.     Mental Status: Normal mood and affect. Normal behavior. Normal judgment and thought content.    Assessment   37 y.o. [id number] at 988w4d, EDD 08/18/2018 by Ultrasound presenting for a routine prenatal visit.  Plan   FIFTH Problems (from 12/20/17 to  present)    Problem Noted Resolved   Advanced maternal age in multigravida 05/22/2018 by Vena AustriaStaebler, Andreas, MD No   Supervision of normal pregnancy 12/20/2017 by Oswaldo ConroySchmid,  Y, CNM No   Overview Addendum 05/22/2018  9:46 AM by Vena AustriaStaebler, Andreas, MD    Clinic [facility] Prenatal Labs  Dating 6w 1d US Blood type: O/Positive/-- (06/04 1157)   Genetic Screen NIPS: Normal XY, AFP negative Antibody:Negative (06/04 1157)  Anatomic US Normal Rubella: 1.02 (06/04 1157) Varicella: Immune  GTT Early: 105              Third trimester:  RPR: Non Reactive (06/04 1157)   Rhogam N/A HBsAg: Negative (06/04 1157)   TDaP vaccine                       Flu Shot: 10/3 HIV: Non Reactive (06/04 1157)   Baby Food                                GBS:   Contraception BTL signed papers 10/31 Pap: 2014 NILM/HPV negative, declines until PP  CBB     CS/VBAC N/A   Support Person Derick              Discussed safe OTC medications for relief of viral URI symptoms.  Advised iron supplementation once daily for slightly low hemoglobin on 28 week panel.  She is feeling that the baby is in a different position than her others have been. Leopold's maneuvers reveal breech at fundus but may be diagonal or slightly transverse lie. Consider scan for positioning if doubt persists.  Will get TDaP next time when she is feeling better.  Please refer to After Visit Summary for other counseling recommendations.   Return in about 2 weeks (around 06/20/2018) for ROB.  Marcelyn Bruins, CNM 06/06/2018  8:32 AM

## 2018-06-16 ENCOUNTER — Other Ambulatory Visit: Payer: Self-pay

## 2018-06-16 ENCOUNTER — Observation Stay
Admission: EM | Admit: 2018-06-16 | Discharge: 2018-06-16 | Disposition: A | Discharge status: Home / Self Care | Payer: Medicaid Other | Attending: Advanced Practice Midwife | Admitting: Advanced Practice Midwife

## 2018-06-16 DIAGNOSIS — O9989 Other specified diseases and conditions complicating pregnancy, childbirth and the puerperium: Principal | ICD-10-CM | POA: Insufficient documentation

## 2018-06-16 DIAGNOSIS — Z3A31 31 weeks gestation of pregnancy: Secondary | ICD-10-CM | POA: Diagnosis not present

## 2018-06-16 DIAGNOSIS — J069 Acute upper respiratory infection, unspecified: Secondary | ICD-10-CM | POA: Diagnosis not present

## 2018-06-16 DIAGNOSIS — O368130 Decreased fetal movements, third trimester, not applicable or unspecified: Secondary | ICD-10-CM | POA: Insufficient documentation

## 2018-06-16 DIAGNOSIS — Z79899 Other long term (current) drug therapy: Secondary | ICD-10-CM | POA: Diagnosis not present

## 2018-06-16 HISTORY — DX: Unspecified asthma, uncomplicated: J45.909

## 2018-06-16 NOTE — Discharge Summary (Signed)
Physician Final Progress Note  Patient ID: Robin French MRN: [id number] DOB/AGE: [birth year] 37 y.o.  Admit date: 06/16/2018 Admitting provider: Natale Milch, MD Discharge date: 06/16/2018   Admission Diagnoses: cough, decreased fetal movement  Discharge Diagnoses:  Active Problems:   Indication for care in labor and delivery, antepartum IUP at 31 weeks, reactive NST, upper respiratory congestion/cough  History of Present Illness: The patient is a 37 y.o. female [phone] at [redacted]w[redacted]d who presents for persistent non-productive cough for the past 2 weeks and decreased fetal movement. Safe OTC medications were reviewed with the patient at her last prenatal visit but she hesitated taking anything. She denies fever, chills, aches, nausea, vomiting, diarrhea, shortness of breath. She does have a headache and accessory muscle pain from coughing. She has not felt the baby move as much as usual today. Safe OTC medications reviewed. Recommended tylenol PM for sleep aid. Increase hydration.   Past Medical History:  Diagnosis Date  . Asthma   . Depression     Past Surgical History:  Procedure Laterality Date  . deviated septum repair    . GANGLION CYST EXCISION    . TONSILLECTOMY AND ADENOIDECTOMY     PART ADENOID  . tubes in ears    . WISDOM TOOTH EXTRACTION     BOTTOM TWO    No current facility-administered medications on file prior to encounter.    Current Outpatient Medications on File Prior to Encounter  Medication Sig Dispense Refill  . Prenat-FeFum-FePo-FA-DHA w/o A (PROVIDA DHA) 16-16-1.25-110 MG CAPS Take 1 capsule by mouth daily. 30 capsule 11  . ferrous sulfate (FERROUSUL) 325 (65 FE) MG tablet Take 1 tablet (325 mg total) by mouth daily with breakfast. (Patient not taking: Reported on 06/16/2018) 30 tablet 3    No Known Allergies  Social History   Socioeconomic History  . Marital status: Married    Spouse name: Not on file  . Number of children: 3  .  Years of education: 47  . Highest education level: Not on file  Occupational History  . Occupation: STAY AT HOME MOM  Social Needs  . Financial resource strain: Not on file  . Food insecurity:    Worry: Not on file    Inability: Not on file  . Transportation needs:    Medical: Not on file    Non-medical: Not on file  Tobacco Use  . Smoking status: Never Smoker  . Smokeless tobacco: Never Used  Substance and Sexual Activity  . Alcohol use: Not Currently  . Drug use: Never  . Sexual activity: Yes    Birth control/protection: None  Lifestyle  . Physical activity:    Days per week: Not on file    Minutes per session: Not on file  . Stress: Not on file  Relationships  . Social connections:    Talks on phone: Not on file    Gets together: Not on file    Attends religious service: Not on file    Active member of club or organization: Not on file    Attends meetings of clubs or organizations: Not on file    Relationship status: Not on file  . Intimate partner violence:    Fear of current or ex partner: Not on file    Emotionally abused: Not on file    Physically abused: Not on file    Forced sexual activity: Not on file  Other Topics Concern  . Not on file  Social History Narrative  .  Not on file    Family History  Problem Relation Age of Onset  . Diabetes Maternal Grandmother   . Heart disease Maternal Grandfather   . Diabetes Paternal Grandmother   . Alzheimer's disease Paternal Grandmother   . Cancer Other        OVARIAN     Review of Systems  Constitutional: Negative.   HENT: Positive for congestion.   Eyes: Negative.   Respiratory: Positive for cough.   Cardiovascular: Negative.   Gastrointestinal: Negative.   Genitourinary: Negative.   Musculoskeletal: Negative.   Skin: Negative.   Neurological: Negative.   Endo/Heme/Allergies: Negative.   Psychiatric/Behavioral: Negative.      Physical Exam: Vital Signs: BP 117/63 (BP Location: Right Arm)   Pulse  93   Temp 99 F (37.2 C) (Oral)   Resp (!) 22   Ht 5\' 2"  (1.575 m)   Wt 92.1 kg   LMP 11/04/2017 (Approximate)   SpO2 98%   BMI 37.13 kg/m  Constitutional: Well nourished, well developed female in no acute distress.  HEENT: sinus mild tender to palpation Skin: Warm and dry.  Cardiovascular: Regular rate and rhythm.   Extremity: no edema  Respiratory: Clear to auscultation bilateral. Normal respiratory effort. Dry cough.  Abdomen: FHT present and non-tender to palpation Back: no CVAT Neuro: DTRs 2+, Cranial nerves grossly intact Psych: Alert and Oriented x3. No memory deficits. Normal mood and affect.  MS: normal gait, normal bilateral lower extremity ROM/strength/stability.  Toco: negative for contractions Fetal well Being: 140 bpm, moderate variability, + 10x10 accelerations, -decelerations  Pelvic exam: deferred  Consults: None  Significant Findings/ Diagnostic Studies: none  Procedures: NST  Hospital Course: The patient was admitted to Labor and Delivery Triage for observation.   Discharge Condition: good  Disposition: Discharge disposition: 01-Home or Self Care       Diet: Regular diet, increase hydration  Discharge Activity: Activity as tolerated  Discharge Instructions    Discharge activity:  No Restrictions   Complete by:  As directed    Discharge diet:  No restrictions   Complete by:  As directed    Fetal Kick Count:  Lie on our left side for one hour after a meal, and count the number of times your baby kicks.  If it is less than 5 times, get up, move around and drink some juice.  Repeat the test 30 minutes later.  If it is still less than 5 kicks in an hour, notify your doctor.   Complete by:  As directed    No sexual activity restrictions   Complete by:  As directed    Notify physician for a general feeling that "something is not right"   Complete by:  As directed    Notify physician for increase or change in vaginal discharge   Complete by:  As  directed    Notify physician for intestinal cramps, with or without diarrhea, sometimes described as "gas pain"   Complete by:  As directed    Notify physician for leaking of fluid   Complete by:  As directed    Notify physician for low, dull backache, unrelieved by heat or Tylenol   Complete by:  As directed    Notify physician for menstrual like cramps   Complete by:  As directed    Notify physician for pelvic pressure   Complete by:  As directed    Notify physician for uterine contractions.  These may be painless and feel like the uterus is tightening  or the baby is  "balling up"   Complete by:  As directed    Notify physician for vaginal bleeding   Complete by:  As directed    PRETERM LABOR:  Includes any of the follwing symptoms that occur between 20 - [redacted] weeks gestation.  If these symptoms are not stopped, preterm labor can result in preterm delivery, placing your baby at risk   Complete by:  As directed      Allergies as of 06/16/2018   No Known Allergies     Medication List    STOP taking these medications   ferrous sulfate 325 (65 FE) MG tablet     TAKE these medications   PROVIDA DHA 16-16-1.25-110 MG Caps Take 1 capsule by mouth daily.      Follow-up Information    [facility]. Go to.   Specialty:  Obstetrics and Gynecology Why:  regular scheduled prenatal appointment Contact information: [address] [place] [address] [phone]          Total time spent taking care of this patient: 15 minutes  Signed: Tresea Mall, CNM  06/16/2018, 10:46 PM

## 2018-06-16 NOTE — OB Triage Note (Signed)
Pt presents from ED with nasal congestion and a persistent cough for almost 3 weeks and decreased fetal movement. Denies bleeding, N/V/D. Endorses intercourse in the last 24 hours.

## 2018-06-16 NOTE — ED Triage Notes (Signed)
Pt presents to ED with frequent persistent nonproductive cough for the past 3 weeks. Currently [redacted] weeks pregnant. Has noticed a decrease in fetal movement for the past couple of days so obgyn felt pt should be evaluated.

## 2018-06-23 ENCOUNTER — Ambulatory Visit (INDEPENDENT_AMBULATORY_CARE_PROVIDER_SITE_OTHER): Payer: Medicaid Other | Admitting: Maternal Newborn

## 2018-06-23 VITALS — BP 112/50 | Wt 206.0 lb

## 2018-06-23 DIAGNOSIS — Z3480 Encounter for supervision of other normal pregnancy, unspecified trimester: Secondary | ICD-10-CM

## 2018-06-23 DIAGNOSIS — Z3A32 32 weeks gestation of pregnancy: Secondary | ICD-10-CM

## 2018-06-23 DIAGNOSIS — Z23 Encounter for immunization: Secondary | ICD-10-CM | POA: Diagnosis not present

## 2018-06-23 DIAGNOSIS — Z3483 Encounter for supervision of other normal pregnancy, third trimester: Secondary | ICD-10-CM

## 2018-06-23 LAB — POCT URINALYSIS DIPSTICK OB: GLUCOSE, UA: NEGATIVE

## 2018-06-23 NOTE — Progress Notes (Signed)
ROB C/o braxton hicks, some tightening under chest bone  BT consent/Tdap today

## 2018-06-23 NOTE — Progress Notes (Signed)
    Routine Prenatal Care Visit  Subjective  Robin French is a 37 y.o. [phone] at 4423w0d being seen today for ongoing prenatal care.  She is currently monitored for the following issues for this low-risk pregnancy and has Supervision of normal pregnancy; Adult BMI 31.0-31.9 kg/sq m; Advanced maternal age in multigravida; and Indication for care in labor and delivery, antepartum on their problem list.  ----------------------------------------------------------------------------------- Patient reports Braxton-Hicks contractions. Coughing and cold symptoms improving and she is feeling better. Contractions: Not present. Vag. Bleeding: None.  Movement: Present. No leaking of fluid.  ----------------------------------------------------------------------------------- The following portions of the patient's history were reviewed and updated as appropriate: allergies, current medications, past family history, past medical history, past social history, past surgical history and problem list. Problem list updated.  Objective  Blood pressure (!) 112/50, weight 206 lb (93.4 kg), last menstrual period 11/04/2017. Pregravid weight 175 lb (79.4 kg) Total Weight Gain 31 lb (14.1 kg) Body mass index is 37.68 kg/m.   Urinalysis: Protein Trace, Glucose Negative  Fetal Status: Fetal Heart Rate (bpm): 147 Fundal Height: 33 cm Movement: Present     General:  Alert, oriented and cooperative. Patient is in no acute distress.  Skin: Skin is warm and dry. No rash noted.   Cardiovascular: Normal heart rate noted  Respiratory: Normal respiratory effort, no problems with respiration noted  Abdomen: Soft, gravid, appropriate for gestational age. Pain/Pressure: Present     Pelvic:  Cervical exam deferred        Extremities: Normal range of motion.     Mental Status: Normal mood and affect. Normal behavior. Normal judgment and thought content.     Assessment   37 y.o. [id number] at 7223w0d, EDD 08/18/2018 by  Ultrasound presenting for a routine prenatal visit.  Plan   FIFTH Problems (from 12/20/17 to present)    Problem Noted Resolved   Advanced maternal age in multigravida 05/22/2018 by Vena AustriaStaebler, Andreas, MD No   Supervision of normal pregnancy 12/20/2017 by Oswaldo ConroySchmid, Cherryl Babin Y, CNM No   Overview Addendum 05/22/2018  9:46 AM by Vena AustriaStaebler, Andreas, MD    Clinic [facility] Prenatal Labs  Dating 6w 1d US Blood type: O/Positive/-- (06/04 1157)   Genetic Screen NIPS: Normal XY, AFP negative Antibody:Negative (06/04 1157)  Anatomic US Normal Rubella: 1.02 (06/04 1157) Varicella: Immune  GTT Early: 105              Third trimester:  RPR: Non Reactive (06/04 1157)   Rhogam N/A HBsAg: Negative (06/04 1157)   TDaP vaccine                       Flu Shot: 10/3 HIV: Non Reactive (06/04 1157)   Baby Food                                GBS:   Contraception BTL signed papers 10/31 Pap: 2014 NILM/HPV negative, declines until PP  CBB     CS/VBAC N/A   Support Person Robin French                TDaP today. She mentioned that an MD has discussed an elective induction at 39 weeks with her and she is thinking about this; will discuss in more detail later in pregnancy.  Return in about 2 weeks (around 07/07/2018) for ROB.  Robin French, CNM 06/23/2018  8:48 AM

## 2018-07-07 ENCOUNTER — Ambulatory Visit (INDEPENDENT_AMBULATORY_CARE_PROVIDER_SITE_OTHER): Payer: Medicaid Other | Admitting: Obstetrics & Gynecology

## 2018-07-07 ENCOUNTER — Ambulatory Visit: Payer: Medicaid Other | Admitting: Obstetrics & Gynecology

## 2018-07-07 VITALS — BP 100/60 | Wt 211.0 lb

## 2018-07-07 DIAGNOSIS — Z3483 Encounter for supervision of other normal pregnancy, third trimester: Secondary | ICD-10-CM

## 2018-07-07 DIAGNOSIS — Z3480 Encounter for supervision of other normal pregnancy, unspecified trimester: Secondary | ICD-10-CM

## 2018-07-07 DIAGNOSIS — O4693 Antepartum hemorrhage, unspecified, third trimester: Secondary | ICD-10-CM

## 2018-07-07 DIAGNOSIS — Z3A34 34 weeks gestation of pregnancy: Secondary | ICD-10-CM

## 2018-07-07 LAB — POCT URINALYSIS DIPSTICK OB
Glucose, UA: NEGATIVE
POC,PROTEIN,UA: NEGATIVE

## 2018-07-07 NOTE — Progress Notes (Signed)
Pt called with spotting this afternoon (after being seen this am)    No sex or other activity other than walking Exam is stable,    SVE closed 30%, high;  Soft and midposition    No blood on glove Monitor for s/sx PTL    If worsening bleeding then [facility]    Also monitor for ctx pain w same instructions  Annamarie MajorPaul Gennette Shadix, MD, Merlinda FrederickFACOG [facility], [facility] 07/07/2018  3:53 PM

## 2018-07-07 NOTE — Progress Notes (Signed)
  Subjective  Fetal Movement? yes Contractions? Yes- 1-2/day mild Leaking Fluid? no Vaginal Bleeding? no  Objective  BP 100/60   Wt 211 lb (95.7 kg)   LMP 11/04/2017 (Approximate)   BMI 38.59 kg/m  General: NAD Pumonary: no increased work of breathing Abdomen: gravid, non-tender Extremities: no edema Psychiatric: mood appropriate, affect full  Assessment  37 y.o. [id number] at 2668w0d by  08/18/2018, by Ultrasound presenting for routine prenatal visit  Plan   Problem List Items Addressed This Visit      Other   Supervision of normal pregnancy - Primary    Other Visit Diagnoses    [redacted] weeks gestation of pregnancy       Relevant Orders   POC Urinalysis Dipstick OB (Completed)    Desires IOL if able 39 weeks (prior to 1/28) Plans BTL PP (papers signed) [facility], [facility]  Annamarie MajorPaul Nica Friske, MD, Merlinda FrederickFACOG [facility], [facility] 07/07/2018  8:43 AM

## 2018-07-07 NOTE — Patient Instructions (Signed)
Braxton Hicks Contractions °Contractions of the uterus can occur throughout pregnancy, but they are not always a sign that you are in labor. You may have practice contractions called Braxton Hicks contractions. These false labor contractions are sometimes confused with true labor. °What are Braxton Hicks contractions? °Braxton Hicks contractions are tightening movements that occur in the muscles of the uterus before labor. Unlike true labor contractions, these contractions do not result in opening (dilation) and thinning of the cervix. Toward the end of pregnancy (32-34 weeks), Braxton Hicks contractions can happen more often and may become stronger. These contractions are sometimes difficult to tell apart from true labor because they can be very uncomfortable. You should not feel embarrassed if you go to the hospital with false labor. °Sometimes, the only way to tell if you are in true labor is for your health care provider to look for changes in the cervix. The health care provider will do a physical exam and may monitor your contractions. If you are not in true labor, the exam should show that your cervix is not dilating and your water has not broken. °If there are other health problems associated with your pregnancy, it is completely safe for you to be sent home with false labor. You may continue to have Braxton Hicks contractions until you go into true labor. °How to tell the difference between true labor and false labor °True labor °· Contractions last 30-70 seconds. °· Contractions become very regular. °· Discomfort is usually felt in the top of the uterus, and it spreads to the lower abdomen and low back. °· Contractions do not go away with walking. °· Contractions usually become more intense and increase in frequency. °· The cervix dilates and gets thinner. °False labor °· Contractions are usually shorter and not as strong as true labor contractions. °· Contractions are usually irregular. °· Contractions  are often felt in the front of the lower abdomen and in the groin. °· Contractions may go away when you walk around or change positions while lying down. °· Contractions get weaker and are shorter-lasting as time goes on. °· The cervix usually does not dilate or become thin. °Follow these instructions at home: °· Take over-the-counter and prescription medicines only as told by your health care provider. °· Keep up with your usual exercises and follow other instructions from your health care provider. °· Eat and drink lightly if you think you are going into labor. °· If Braxton Hicks contractions are making you uncomfortable: °? Change your position from lying down or resting to walking, or change from walking to resting. °? Sit and rest in a tub of warm water. °? Drink enough fluid to keep your urine pale yellow. Dehydration may cause these contractions. °? Do slow and deep breathing several times an hour. °· Keep all follow-up prenatal visits as told by your health care provider. This is important. °Contact a health care provider if: °· You have a fever. °· You have continuous pain in your abdomen. °Get help right away if: °· Your contractions become stronger, more regular, and closer together. °· You have fluid leaking or gushing from your vagina. °· You pass blood-tinged mucus (bloody show). °· You have bleeding from your vagina. °· You have low back pain that you never had before. °· You feel your baby’s head pushing down and causing pelvic pressure. °· Your baby is not moving inside you as much as it used to. °Summary °· Contractions that occur before labor are called Braxton   Hicks contractions, false labor, or practice contractions. °· Braxton Hicks contractions are usually shorter, weaker, farther apart, and less regular than true labor contractions. True labor contractions usually become progressively stronger and regular and they become more frequent. °· Manage discomfort from Braxton Hicks contractions by  changing position, resting in a warm bath, drinking plenty of water, or practicing deep breathing. °This information is not intended to replace advice given to you by your health care provider. Make sure you discuss any questions you have with your health care provider. °Document Released: 11/22/2016 Document Revised: 11/22/2016 Document Reviewed: 11/22/2016 °[employer]vier Interactive Patient Education © 2018 [employer] ° °

## 2018-07-21 ENCOUNTER — Ambulatory Visit (INDEPENDENT_AMBULATORY_CARE_PROVIDER_SITE_OTHER): Payer: Medicaid Other | Admitting: Obstetrics & Gynecology

## 2018-07-21 VITALS — BP 120/70 | Wt 211.0 lb

## 2018-07-21 DIAGNOSIS — Z3A36 36 weeks gestation of pregnancy: Secondary | ICD-10-CM | POA: Diagnosis not present

## 2018-07-21 DIAGNOSIS — Z3480 Encounter for supervision of other normal pregnancy, unspecified trimester: Secondary | ICD-10-CM

## 2018-07-21 DIAGNOSIS — O09523 Supervision of elderly multigravida, third trimester: Secondary | ICD-10-CM

## 2018-07-21 NOTE — Patient Instructions (Signed)
Group B Streptococcus Infection During Pregnancy  Group B Streptococcus (GBS) is a type of bacteria (Streptococcus agalactiae) that is often found in healthy people, commonly in the rectum, vagina, and intestines. In people who are healthy and not pregnant, the bacteria rarely cause serious illness or complications. However, women who test positive for GBS during pregnancy can pass the bacteria to their baby during childbirth, which can cause serious infection in the baby after birth. Women with GBS may also have infections during their pregnancy or immediately after childbirth, such as such as urinary tract infections (UTIs) or infections of the uterus (uterine infections). Having GBS also increases a woman's risk of complications during pregnancy, such as early (preterm) labor or delivery, miscarriage, or stillbirth. Routine testing (screening) for GBS is recommended for all pregnant women. What increases the risk? You may have a higher risk for GBS infection during pregnancy if you had one during a past pregnancy. What are the signs or symptoms? In most cases, GBS infection does not cause symptoms in pregnant women. Signs and symptoms of a possible GBS-related infection may include:  Labor starting before the 37th week of pregnancy.  A UTI or bladder infection, which may cause: ? Fever. ? Pain or burning during urination. ? Frequent urination.  Fever during labor, along with: ? Bad-smelling discharge. ? Uterine tenderness. ? Rapid heartbeat in the mother, baby, or both. Rare but serious symptoms of a possible GBS-related infection in women include:  Blood infection (septicemia). This may cause fever, chills, or confusion.  Lung infection (pneumonia). This may cause fever, chills, cough, rapid breathing, difficulty breathing, or chest pain.  Bone, joint, skin, or soft tissue infection. How is this diagnosed? You may be screened for GBS between week 35 and week 37 of your pregnancy. If  you have symptoms of preterm labor, you may be screened earlier. This condition is diagnosed based on lab test results from:  A swab of fluid from the vagina and rectum.  A urine sample. How is this treated? This condition is treated with antibiotic medicine. When you go into labor, or as soon as your water breaks (your membranes rupture), you will be given antibiotics through an IV tube. Antibiotics will continue until after you give birth. If you are having a cesarean delivery, you do not need antibiotics unless your membranes have already ruptured. Follow these instructions at home:  Take over-the-counter and prescription medicines only as told by your health care provider.  Take your antibiotic medicine as told by your health care provider. Do not stop taking the antibiotic even if you start to feel better.  Keep all pre-birth (prenatal) visits and follow-up visits as told by your health care provider. This is important. Contact a health care provider if:  You have pain or burning when you urinate.  You have to urinate frequently.  You have a fever or chills.  You develop a bad-smelling vaginal discharge. Get help right away if:  Your membranes rupture.  You go into labor.  You have severe pain in your abdomen.  You have difficulty breathing.  You have chest pain. This information is not intended to replace advice given to you by your health care provider. Make sure you discuss any questions you have with your health care provider. Document Released: 10/16/2007 Document Revised: 02/03/2016 Document Reviewed: 02/02/2016 Elsevier Interactive Patient Education  2019 Elsevier Inc.  

## 2018-07-21 NOTE — Progress Notes (Signed)
  Subjective  Fetal Movement? yes Contractions? Yes, 2-4 per day Leaking Fluid? no Vaginal Bleeding? no  Objective  BP 120/70   Wt 211 lb (95.7 kg)   LMP 11/04/2017 (Approximate)   BMI 38.59 kg/m  General: NAD Pumonary: no increased work of breathing Abdomen: gravid, non-tender Extremities: no edema Psychiatric: mood appropriate, affect full SVE FT/30/-3 Assessment  37 y.o. [id number] at 4166w0d by  08/18/2018, by Ultrasound presenting for routine prenatal visit  Plan   Problem List Items Addressed This Visit      Other   Supervision of normal pregnancy   Advanced maternal age in multigravida    Other Visit Diagnoses    [redacted] weeks gestation of pregnancy    -  Primary   Relevant Orders   Culture, beta strep (group b only)    Labor precautions. Deliver before 08/19/18  Annamarie MajorPaul Taelar Gronewold, MD, Merlinda FrederickFACOG [facility], [facility] 07/21/2018  11:36 AM

## 2018-07-23 NOTE — L&D Delivery Note (Signed)
Delivery Note Primary OB: [facility] Delivery Provider: Marcelyn BruinsJacelyn Schmid, CNM Gestational Age: Full term Antepartum complications: none Intrapartum complications: Shoulder Dystocia  I was called to the room for delivery as the patient had made rapid cervical change and was complete and pushing following her epidural. A viable Female was delivered via vertex presentation, OA. A loose nuchal cord was reduced and delivered through. Delivery of the anterior shoulder did not take place with gentle downward pressure. The patient was positioned in [place] position to attempt to deliver the anterior shoulder, followed by suprapubic pressure. When these maneuvers were not successful, the posterior arm was gently released, after which delivery of the shoulders and body followed without difficulty. The infant was placed on the maternal abdomen. He vas vigorous and cried spontaneously. He was moving all extremities well without injury. The umbilical cord was doubly clamped and cut following delayed cord clamping. Cord blood was collected. The placenta was delivered spontaneously and was inspected and found to be intact with a three vessel cord. The cervix and vagina were inspected. There were no lacerations. The fundus was firm. Patient and infant were bonding in stable condition. All counts were correct.  Apgars: 8, 9  Weight:  7 lb., 8.3 oz.   Placenta status: spontaneous and Intact.  Cord: 3 vessels;  with the following complications: none.  Anesthesia:  epidural Episiotomy:  none Lacerations:  none Suture Repair: none Est. Blood Loss (mL):  250  Mom to postpartum.  Baby to Couplet care / Skin to Skin.  Marcelyn BruinsJacelyn Schmid, CNM [facility], [facility] 08/11/2018

## 2018-07-25 LAB — CULTURE, BETA STREP (GROUP B ONLY): Strep Gp B Culture: NEGATIVE

## 2018-07-28 ENCOUNTER — Ambulatory Visit (INDEPENDENT_AMBULATORY_CARE_PROVIDER_SITE_OTHER): Payer: Medicaid Other | Admitting: Obstetrics and Gynecology

## 2018-07-28 VITALS — BP 112/70 | Wt 215.0 lb

## 2018-07-28 DIAGNOSIS — Z3A37 37 weeks gestation of pregnancy: Secondary | ICD-10-CM

## 2018-07-28 DIAGNOSIS — O09523 Supervision of elderly multigravida, third trimester: Secondary | ICD-10-CM

## 2018-07-28 DIAGNOSIS — O09299 Supervision of pregnancy with other poor reproductive or obstetric history, unspecified trimester: Secondary | ICD-10-CM

## 2018-07-28 DIAGNOSIS — O2603 Excessive weight gain in pregnancy, third trimester: Secondary | ICD-10-CM

## 2018-07-28 DIAGNOSIS — O09293 Supervision of pregnancy with other poor reproductive or obstetric history, third trimester: Secondary | ICD-10-CM

## 2018-07-28 DIAGNOSIS — Z3480 Encounter for supervision of other normal pregnancy, unspecified trimester: Secondary | ICD-10-CM

## 2018-07-28 LAB — POCT URINALYSIS DIPSTICK OB

## 2018-07-28 NOTE — Progress Notes (Signed)
ROB

## 2018-07-28 NOTE — Progress Notes (Signed)
Routine Prenatal Care Visit  Subjective  Robin French is a 38 y.o. [phone] at [redacted]w[redacted]d being seen today for ongoing prenatal care.  She is currently monitored for the following issues for this high-risk pregnancy and has Supervision of normal pregnancy; Adult BMI 31.0-31.9 kg/sq m; and Advanced maternal age in multigravida on their problem list.  ----------------------------------------------------------------------------------- Patient reports no complaints.   Contractions: Irregular. Vag. Bleeding: None.  Movement: Present. Denies leaking of fluid.  ----------------------------------------------------------------------------------- The following portions of the patient's history were reviewed and updated as appropriate: allergies, current medications, past family history, past medical history, past social history, past surgical history and problem list. Problem list updated.   Objective  Blood pressure 112/70, weight 215 lb (97.5 kg), last menstrual period 11/04/2017. Pregravid weight 175 lb (79.4 kg) Total Weight Gain 40 lb (18.1 kg) Urinalysis:      Fetal Status: Fetal Heart Rate (bpm): 140 Fundal Height: 36 cm Movement: Present  Presentation: Vertex  General:  Alert, oriented and cooperative. Patient is in no acute distress.  Skin: Skin is warm and dry. No rash noted.   Cardiovascular: Normal heart rate noted  Respiratory: Normal respiratory effort, no problems with respiration noted  Abdomen: Soft, gravid, appropriate for gestational age. Pain/Pressure: Present     Pelvic:  Cervical exam deferred        Extremities: Normal range of motion.     ental Status: Normal mood and affect. Normal behavior. Normal judgment and thought content.   Immunization History  Administered Date(s) Administered  . Influenza,inj,Quad PF,6+ Mos 04/24/2018  . Tdap 01/02/2013, 06/23/2018     Assessment   37 y.o. [id number] at [redacted]w[redacted]d by  08/18/2018, by Ultrasound presenting for routine  prenatal visit  Plan   FIFTH Problems (from 12/20/17 to present)    Problem Noted Resolved   Advanced maternal age in multigravida 05/22/2018 by Vena Austria, MD No   Supervision of normal pregnancy 12/20/2017 by Oswaldo Conroy, CNM No   Overview Addendum 05/22/2018  9:46 AM by Vena Austria, MD    Clinic [facility] Prenatal Labs  Dating 6w 1d [place] Blood type: O/Positive/-- (06/04 1157)   Genetic Screen NIPS: Normal XY, AFP negative Antibody:Negative (06/04 1157)  Anatomic [place] Normal Rubella: 1.02 (06/04 1157) Varicella: Immune  GTT Early: 105              Third trimester:  RPR: Non Reactive (06/04 1157)   Rhogam N/A HBsAg: Negative (06/04 1157)   TDaP vaccine                       Flu Shot: 10/3 HIV: Non Reactive (06/04 1157)   Baby Food                                GBS:   Contraception BTL signed papers 10/31 Pap: 2014 NILM/HPV negative, declines until PP  CBB     CS/VBAC N/A   Support Person Derick                 Gestational age appropriate obstetric precautions including but not limited to vaginal bleeding, contractions, leaking of fluid and fetal movement were reviewed in detail with the patient.  - growth scan next week history of infant >9lbs prior pregnancy and 40lbs weight gain this pregnancy   Return in about 1 week (around 08/04/2018) for ROB and growth scan.  Vena Austria, MD, Evern Core  [facility], [facility] 07/28/2018, 11:22 AM

## 2018-08-04 ENCOUNTER — Ambulatory Visit (INDEPENDENT_AMBULATORY_CARE_PROVIDER_SITE_OTHER): Payer: Medicaid Other

## 2018-08-04 ENCOUNTER — Ambulatory Visit (INDEPENDENT_AMBULATORY_CARE_PROVIDER_SITE_OTHER): Payer: Medicaid Other | Admitting: Obstetrics & Gynecology

## 2018-08-04 VITALS — BP 120/80 | Wt 214.0 lb

## 2018-08-04 DIAGNOSIS — O2603 Excessive weight gain in pregnancy, third trimester: Secondary | ICD-10-CM | POA: Diagnosis not present

## 2018-08-04 DIAGNOSIS — O09523 Supervision of elderly multigravida, third trimester: Secondary | ICD-10-CM

## 2018-08-04 DIAGNOSIS — Z3A38 38 weeks gestation of pregnancy: Secondary | ICD-10-CM

## 2018-08-04 DIAGNOSIS — O09293 Supervision of pregnancy with other poor reproductive or obstetric history, third trimester: Secondary | ICD-10-CM | POA: Diagnosis not present

## 2018-08-04 DIAGNOSIS — O09299 Supervision of pregnancy with other poor reproductive or obstetric history, unspecified trimester: Secondary | ICD-10-CM

## 2018-08-04 DIAGNOSIS — Z3A37 37 weeks gestation of pregnancy: Secondary | ICD-10-CM

## 2018-08-04 DIAGNOSIS — Z3480 Encounter for supervision of other normal pregnancy, unspecified trimester: Secondary | ICD-10-CM

## 2018-08-04 LAB — POCT URINALYSIS DIPSTICK OB
Glucose, UA: NEGATIVE
POC,PROTEIN,UA: NEGATIVE

## 2018-08-04 NOTE — Progress Notes (Signed)
  Subjective  Fetal Movement? yes Contractions? yes Leaking Fluid? no Vaginal Bleeding? no  Objective  BP 120/80   Wt 214 lb (97.1 kg)   LMP 11/04/2017 (Approximate)   BMI 39.14 kg/m  General: NAD Pumonary: no increased work of breathing Abdomen: gravid, non-tender Extremities: no edema Psychiatric: mood appropriate, affect full  Assessment  38 y.o. [id number] at [redacted]w[redacted]d by  08/18/2018, by Ultrasound presenting for routine prenatal visit  Plan   Problem List Items Addressed This Visit      Other   Supervision of normal pregnancy   Advanced maternal age in multigravida    Other Visit Diagnoses    [redacted] weeks gestation of pregnancy    -  Primary   Relevant Orders   POC Urinalysis Dipstick OB (Completed)   History of macrosomia in infant in prior pregnancy, currently pregnant        Review of ULTRASOUND.    I have personally reviewed images and report of recent ultrasound done at [facility].    Plan of management to be discussed with patient.  Plan cervical check Friday and IOL when favorable Bishop after 39 weeks  Annamarie Major, MD, [facility], [facility] 08/04/2018  3:09 PM

## 2018-08-08 ENCOUNTER — Ambulatory Visit (INDEPENDENT_AMBULATORY_CARE_PROVIDER_SITE_OTHER): Payer: Medicaid Other | Admitting: Obstetrics & Gynecology

## 2018-08-08 VITALS — BP 120/80 | Wt 213.0 lb

## 2018-08-08 DIAGNOSIS — O09293 Supervision of pregnancy with other poor reproductive or obstetric history, third trimester: Secondary | ICD-10-CM

## 2018-08-08 DIAGNOSIS — Z3A38 38 weeks gestation of pregnancy: Secondary | ICD-10-CM

## 2018-08-08 DIAGNOSIS — O09523 Supervision of elderly multigravida, third trimester: Secondary | ICD-10-CM

## 2018-08-08 DIAGNOSIS — Z3480 Encounter for supervision of other normal pregnancy, unspecified trimester: Secondary | ICD-10-CM

## 2018-08-08 DIAGNOSIS — O09299 Supervision of pregnancy with other poor reproductive or obstetric history, unspecified trimester: Secondary | ICD-10-CM

## 2018-08-08 NOTE — Patient Instructions (Signed)
Labor Induction Monday 8:00am  Labor induction is when steps are taken to cause a pregnant woman to begin the labor process. Most women go into labor on their own between 37 weeks and 42 weeks of pregnancy. When this does not happen or when there is a medical need for labor to begin, steps may be taken to induce labor. Labor induction causes a pregnant woman's uterus to contract. It also causes the cervix to soften (ripen), open (dilate), and thin out (efface). Usually, labor is not induced before 39 weeks of pregnancy unless there is a medical reason to do so. Your health care provider will determine if labor induction is needed. Before inducing labor, your health care provider will consider a number of factors, including:  Your medical condition and your baby's.  How many weeks along you are in your pregnancy.  How mature your baby's lungs are.  The condition of your cervix.  The position of your baby.  The size of your birth canal. What are some reasons for labor induction? Labor may be induced if:  Your health or your baby's health is at risk.  Your pregnancy is overdue by 1 week or more.  Your water breaks but labor does not start on its own.  There is a low amount of amniotic fluid around your baby. You may also choose (elect) to have labor induced at a certain time. Generally, elective labor induction is done no earlier than 39 weeks of pregnancy. What methods are used for labor induction? Methods used for labor induction include:  Prostaglandin medicine. This medicine starts contractions and causes the cervix to dilate and ripen. It can be taken by mouth (orally) or by being inserted into the vagina (suppository).  Inserting a small, thin tube (catheter) with a balloon into the vagina and then expanding the balloon with water to dilate the cervix.  Stripping the membranes. In this method, your health care provider gently separates amniotic sac tissue from the cervix. This  causes the cervix to stretch, which in turn causes the release of a hormone called progesterone. The hormone causes the uterus to contract. This procedure is often done during an office visit, after which you will be sent home to wait for contractions to begin.  Breaking the water. In this method, your health care provider uses a small instrument to make a small hole in the amniotic sac. This eventually causes the amniotic sac to break. Contractions should begin after a few hours.  Medicine to trigger or strengthen contractions. This medicine is given through an IV that is inserted into a vein in your arm. Except for membrane stripping, which can be done in a clinic, labor induction is done in the hospital so that you and your baby can be carefully monitored. How long does it take for labor to be induced? The length of time it takes to induce labor depends on how ready your body is for labor. Some inductions can take up to 2-3 days, while others may take less than a day. Induction may take longer if:  You are induced early in your pregnancy.  It is your first pregnancy.  Your cervix is not ready. What are some risks associated with labor induction? Some risks associated with labor induction include:  Changes in fetal heart rate, such as being too high, too low, or irregular (erratic).  Failed induction.  Infection in the mother or the baby.  Increased risk of having a cesarean delivery.  Fetal death.  Breaking  off (abruption) of the placenta from the uterus (rare).  Rupture of the uterus (very rare). When induction is needed for medical reasons, the benefits of induction generally outweigh the risks. What are some reasons for not inducing labor? Labor induction should not be done if:  Your baby does not tolerate contractions.  You have had previous surgeries on your uterus, such as a myomectomy, removal of fibroids, or a vertical scar from a previous cesarean delivery.  Your  placenta lies very low in your uterus and blocks the opening of the cervix (placenta previa).  Your baby is not in a head-down position.  The umbilical cord drops down into the birth canal in front of the baby.  There are unusual circumstances, such as the baby being very early (premature).  You have had more than 2 previous cesarean deliveries. Summary  Labor induction is when steps are taken to cause a pregnant woman to begin the labor process.  Labor induction causes a pregnant woman's uterus to contract. It also causes the cervix to ripen, dilate, and efface.  Labor is not induced before 39 weeks of pregnancy unless there is a medical reason to do so.  When induction is needed for medical reasons, the benefits of induction generally outweigh the risks. This information is not intended to replace advice given to you by your health care provider. Make sure you discuss any questions you have with your health care provider. Document Released: 11/28/2006 Document Revised: 08/22/2016 Document Reviewed: 08/22/2016 Elsevier Interactive Patient Education  2019 [employer]

## 2018-08-08 NOTE — Progress Notes (Signed)
History and Physical  Robin French is a 38 y.o. [id number] currently 39 4/7 for Induction of Labor scheduled due to Favorable cervix at term and history of macrosomia and shoulder dystocia (scheduled for after she is 39 weeks).   See labor record for pregnancy highlights.  No recent pain, bleeding, ruptured membranes, or other signs of progressing labor.  Recent US suggested EFW 7 lbs 6 oz at 38 weeks.   PMHx: She  has a past medical history of Asthma and Depression. Also,  has a past surgical history that includes deviated septum repair; Ganglion cyst excision; tubes in ears; Tonsillectomy and adenoidectomy; and Wisdom tooth extraction., family history includes Alzheimer's disease in her paternal grandmother; Cancer in an other family member; Diabetes in her maternal grandmother and paternal grandmother; Heart disease in her maternal grandfather.,  reports that she has never smoked. She has never used smokeless tobacco. She reports previous alcohol use. She reports that she does not use drugs. She has a current medication list which includes the following prescription(s): provida dha. Also, has No Known Allergies. OB History  Gravida Para Term Preterm AB Living  5 3 3   1 3   SAB TAB Ectopic Multiple Live Births  1       3    # Outcome Date GA Lbr Len/2nd Weight Sex Delivery Anes PTL Lv  5 Current           4 Term 03/05/13   7 lb 6 oz (3.345 kg) M Vag-Spont   LIV  3 Term 10/04/07 3490w0d  8 lb 9 oz (3.884 kg) M Vag-Spont   LIV     Birth Comments: IOL AT 4540.3WKS FOR HX SHOULDER DYSTOCIA.  2 Term 05/26/03 6955w0d  9 lb 1 oz (4.111 kg) F Vag-Spont   LIV     Complications: Shoulder Dystocia  1 SAB           Patient denies any other pertinent gynecologic issues.   Review of Systems  Constitutional: Negative for chills, fever and malaise/fatigue.  HENT: Negative for congestion, sinus pain and sore throat.   Eyes: Negative for blurred vision and pain.  Respiratory: Negative for cough and  wheezing.   Cardiovascular: Negative for chest pain and leg swelling.  Gastrointestinal: Negative for abdominal pain, constipation, diarrhea, heartburn, nausea and vomiting.  Genitourinary: Negative for dysuria, frequency, hematuria and urgency.  Musculoskeletal: Negative for back pain, joint pain, myalgias and neck pain.  Skin: Negative for itching and rash.  Neurological: Negative for dizziness, tremors and weakness.  Endo/Heme/Allergies: Does not bruise/bleed easily.  Psychiatric/Behavioral: Negative for depression. The patient is not nervous/anxious and does not have insomnia.     Objective: BP 120/80   Wt 213 lb (96.6 kg)   LMP 11/04/2017 (Approximate)   BMI 38.96 kg/m  Physical Exam Constitutional:      General: She is not in acute distress.    Appearance: She is well-developed.  Genitourinary:     Pelvic exam was performed with patient supine.     Vagina normal.     No lesions in the vagina.     No vaginal bleeding.     No cervical motion tenderness, friability, lesion or polyp.     Uterus is enlarged.     No uterine mass detected.    Uterus is midaxial.     No right or left adnexal mass present.     Right adnexa not tender.     Left adnexa not tender.  Genitourinary Comments: Cx 1/60/-2, VTX  HENT:     Head: Normocephalic and atraumatic. No laceration.     Right Ear: Hearing normal.     Left Ear: Hearing normal.     Mouth/Throat:     Pharynx: Uvula midline.  Eyes:     Pupils: Pupils are equal, round, and reactive to light.  Neck:     Musculoskeletal: Normal range of motion and neck supple.     Thyroid: No thyromegaly.  Cardiovascular:     Rate and Rhythm: Normal rate and regular rhythm.     Heart sounds: No murmur. No friction rub. No gallop.   Pulmonary:     Effort: Pulmonary effort is normal. No respiratory distress.     Breath sounds: Normal breath sounds. No wheezing.  Chest:     Breasts:        Right: No mass, skin change or tenderness.         Left: No mass, skin change or tenderness.  Abdominal:     General: Bowel sounds are normal. There is no distension.     Palpations: Abdomen is soft.     Tenderness: There is no abdominal tenderness. There is no rebound.     Comments: Gravid, NT, FHT 140s, Vtx  Musculoskeletal: Normal range of motion.  Neurological:     Mental Status: She is alert and oriented to person, place, and time.     Cranial Nerves: No cranial nerve deficit.  Skin:    General: Skin is warm and dry.  Psychiatric:        Judgment: Judgment normal.  Vitals signs reviewed.   Assessment: Term Pregnancy for Induction of Labor due to Favorable cervix at term and history of macrosomia and shoulder dystocia.  Plan: Patient will undergo induction of labor with pitocin.     Patient has been fully informed of the pros and cons, risks and benefits of continued observation with fetal monitoring versus that of induction of labor.   She understands that there are uncommon risks to induction, which include but are not limited to : frequent or prolonged uterine contractions, fetal distress, uterine rupture, and lack of successful induction.  These risks include all methods including Pitocin and Misoprostol and Cervadil.  Patient understands that using Misoprostol for labor induction is an "off label" indication although it has been studied extensively for this purpose and is an accepted method of induction.  She also has been informed of the increased risks for Cesarean with induction and should induction not be successful.  Patient consents to the induction plan of management.  Plans to breast/bottle feed Plans post partum BTL for contraception, papers signed TDaP UTD  Annamarie Major, MD, [facility], [facility] 08/08/2018  10:15 AM

## 2018-08-11 ENCOUNTER — Inpatient Hospital Stay: Payer: Medicaid Other | Admitting: Anesthesiology

## 2018-08-11 ENCOUNTER — Inpatient Hospital Stay
Admission: RE | Admit: 2018-08-11 | Discharge: 2018-08-13 | DRG: 807 | Disposition: A | Discharge status: Home / Self Care | Payer: Medicaid Other | Attending: Maternal Newborn | Admitting: Maternal Newborn

## 2018-08-11 ENCOUNTER — Other Ambulatory Visit: Payer: Self-pay

## 2018-08-11 DIAGNOSIS — Z3A39 39 weeks gestation of pregnancy: Secondary | ICD-10-CM

## 2018-08-11 DIAGNOSIS — O09299 Supervision of pregnancy with other poor reproductive or obstetric history, unspecified trimester: Secondary | ICD-10-CM

## 2018-08-11 DIAGNOSIS — O26893 Other specified pregnancy related conditions, third trimester: Secondary | ICD-10-CM | POA: Diagnosis present

## 2018-08-11 DIAGNOSIS — O09523 Supervision of elderly multigravida, third trimester: Secondary | ICD-10-CM

## 2018-08-11 DIAGNOSIS — Z3480 Encounter for supervision of other normal pregnancy, unspecified trimester: Secondary | ICD-10-CM

## 2018-08-11 LAB — CBC
HCT: 31.5 % — ABNORMAL LOW (ref 36.0–46.0)
Hemoglobin: 10 g/dL — ABNORMAL LOW (ref 12.0–15.0)
MCH: 27.2 pg (ref 26.0–34.0)
MCHC: 31.7 g/dL (ref 30.0–36.0)
MCV: 85.8 fL (ref 80.0–100.0)
NRBC: 0 % (ref 0.0–0.2)
Platelets: 160 10*3/uL (ref 150–400)
RBC: 3.67 MIL/uL — ABNORMAL LOW (ref 3.87–5.11)
RDW: 13.7 % (ref 11.5–15.5)
WBC: 10.1 10*3/uL (ref 4.0–10.5)

## 2018-08-11 LAB — TYPE AND SCREEN
ABO/RH(D): O POS
Antibody Screen: NEGATIVE

## 2018-08-11 MED ORDER — BENZOCAINE-MENTHOL 20-0.5 % EX AERO: 1.0000 "application " | INHALATION_SPRAY | CUTANEOUS | Status: DC | PRN

## 2018-08-11 MED ORDER — LIDOCAINE HCL (PF) 1 % IJ SOLN
INTRAMUSCULAR | Status: DC | PRN
  Administered 2018-08-11 (×2): 2 mL

## 2018-08-11 MED ORDER — MISOPROSTOL 200 MCG PO TABS
ORAL_TABLET | ORAL | Status: AC
  Filled 2018-08-11: qty 4

## 2018-08-11 MED ORDER — ONDANSETRON HCL 4 MG/2ML IJ SOLN: 4.0000 mg | Freq: Four times a day (QID) | INTRAMUSCULAR | Status: DC | PRN

## 2018-08-11 MED ORDER — BUTORPHANOL TARTRATE 1 MG/ML IJ SOLN
1.0000 mg | INTRAMUSCULAR | Status: DC | PRN
  Filled 2018-08-11: qty 1

## 2018-08-11 MED ORDER — SIMETHICONE 80 MG PO CHEW
80.0000 mg | CHEWABLE_TABLET | ORAL | Status: DC | PRN
  Administered 2018-08-12: 80 mg via ORAL
  Filled 2018-08-11: qty 1

## 2018-08-11 MED ORDER — LACTATED RINGERS IV SOLN
INTRAVENOUS | Status: DC
  Administered 2018-08-11 (×2): via INTRAVENOUS

## 2018-08-11 MED ORDER — FENTANYL 2.5 MCG/ML W/ROPIVACAINE 0.15% IN NS 100 ML EPIDURAL (ARMC)
12.0000 mL/h | EPIDURAL | Status: DC
  Administered 2018-08-11: 12 mL/h via EPIDURAL
  Filled 2018-08-11: qty 100

## 2018-08-11 MED ORDER — DIPHENHYDRAMINE HCL 25 MG PO CAPS: 25.0000 mg | ORAL_CAPSULE | Freq: Four times a day (QID) | ORAL | Status: DC | PRN

## 2018-08-11 MED ORDER — ACETAMINOPHEN 325 MG PO TABS
650.0000 mg | ORAL_TABLET | ORAL | Status: DC | PRN
  Administered 2018-08-11 – 2018-08-13 (×2): 650 mg via ORAL
  Filled 2018-08-11 (×2): qty 2

## 2018-08-11 MED ORDER — COCONUT OIL OIL: 1.0000 "application " | TOPICAL_OIL | Status: DC | PRN

## 2018-08-11 MED ORDER — TERBUTALINE SULFATE 1 MG/ML IJ SOLN: 0.2500 mg | Freq: Once | INTRAMUSCULAR | Status: DC | PRN

## 2018-08-11 MED ORDER — DIBUCAINE 1 % RE OINT: 1.0000 "application " | TOPICAL_OINTMENT | RECTAL | Status: DC | PRN

## 2018-08-11 MED ORDER — SENNOSIDES-DOCUSATE SODIUM 8.6-50 MG PO TABS
2.0000 | ORAL_TABLET | ORAL | Status: DC
  Administered 2018-08-11 – 2018-08-13 (×3): 2 via ORAL
  Filled 2018-08-11 (×3): qty 2

## 2018-08-11 MED ORDER — DIPHENHYDRAMINE HCL 50 MG/ML IJ SOLN: 12.5000 mg | INTRAMUSCULAR | Status: DC | PRN

## 2018-08-11 MED ORDER — PRENATAL MULTIVITAMIN CH
1.0000 | ORAL_TABLET | Freq: Every day | ORAL | Status: DC
  Administered 2018-08-12: 1 via ORAL
  Filled 2018-08-11: qty 1

## 2018-08-11 MED ORDER — WITCH HAZEL-GLYCERIN EX PADS: 1.0000 "application " | MEDICATED_PAD | CUTANEOUS | Status: DC | PRN

## 2018-08-11 MED ORDER — OXYTOCIN 40 UNITS IN NORMAL SALINE INFUSION - SIMPLE MED
1.0000 m[IU]/min | INTRAVENOUS | Status: DC
  Administered 2018-08-11: 1 m[IU]/min via INTRAVENOUS
  Filled 2018-08-11: qty 1000

## 2018-08-11 MED ORDER — LIDOCAINE HCL (PF) 1 % IJ SOLN
INTRAMUSCULAR | Status: AC
  Filled 2018-08-11: qty 30

## 2018-08-11 MED ORDER — MISOPROSTOL 25 MCG QUARTER TABLET
25.0000 ug | ORAL_TABLET | ORAL | Status: DC | PRN
  Administered 2018-08-11 (×2): 25 ug via VAGINAL
  Filled 2018-08-11: qty 1

## 2018-08-11 MED ORDER — OXYCODONE-ACETAMINOPHEN 5-325 MG PO TABS
2.0000 | ORAL_TABLET | ORAL | Status: DC | PRN
  Administered 2018-08-12 (×3): 2 via ORAL
  Filled 2018-08-11 (×3): qty 2

## 2018-08-11 MED ORDER — OXYTOCIN 40 UNITS IN NORMAL SALINE INFUSION - SIMPLE MED
2.5000 [IU]/h | INTRAVENOUS | Status: DC
  Administered 2018-08-11: 2.5 [IU]/h via INTRAVENOUS

## 2018-08-11 MED ORDER — LACTATED RINGERS IV SOLN: 500.0000 mL | Freq: Once | INTRAVENOUS | Status: DC

## 2018-08-11 MED ORDER — ONDANSETRON HCL 4 MG/2ML IJ SOLN: 4.0000 mg | INTRAMUSCULAR | Status: DC | PRN

## 2018-08-11 MED ORDER — EPHEDRINE 5 MG/ML INJ
10.0000 mg | INTRAVENOUS | Status: DC | PRN
  Filled 2018-08-11: qty 2

## 2018-08-11 MED ORDER — LIDOCAINE-EPINEPHRINE (PF) 1.5 %-1:200000 IJ SOLN
INTRAMUSCULAR | Status: DC | PRN
  Administered 2018-08-11: 3 mL via EPIDURAL

## 2018-08-11 MED ORDER — SODIUM CHLORIDE FLUSH 0.9 % IV SOLN
INTRAVENOUS | Status: AC
  Filled 2018-08-11: qty 30

## 2018-08-11 MED ORDER — PHENYLEPHRINE 40 MCG/ML (10ML) SYRINGE FOR IV PUSH (FOR BLOOD PRESSURE SUPPORT)
80.0000 ug | PREFILLED_SYRINGE | INTRAVENOUS | Status: DC | PRN
  Filled 2018-08-11: qty 10

## 2018-08-11 MED ORDER — LACTATED RINGERS IV SOLN: INTRAVENOUS | Status: DC

## 2018-08-11 MED ORDER — LACTATED RINGERS IV SOLN: 500.0000 mL | INTRAVENOUS | Status: DC | PRN

## 2018-08-11 MED ORDER — OXYTOCIN BOLUS FROM INFUSION
500.0000 mL | Freq: Once | INTRAVENOUS | Status: AC
  Administered 2018-08-11: 500 mL via INTRAVENOUS

## 2018-08-11 MED ORDER — OXYCODONE-ACETAMINOPHEN 5-325 MG PO TABS
1.0000 | ORAL_TABLET | ORAL | Status: DC | PRN
  Administered 2018-08-12 – 2018-08-13 (×3): 1 via ORAL
  Filled 2018-08-11 (×3): qty 1

## 2018-08-11 MED ORDER — AMMONIA AROMATIC IN INHA
RESPIRATORY_TRACT | Status: AC
  Filled 2018-08-11: qty 10

## 2018-08-11 MED ORDER — MISOPROSTOL 25 MCG QUARTER TABLET
ORAL_TABLET | ORAL | Status: AC
  Administered 2018-08-11: 25 ug via VAGINAL
  Filled 2018-08-11: qty 1

## 2018-08-11 MED ORDER — ACETAMINOPHEN 325 MG PO TABS: 650.0000 mg | ORAL_TABLET | ORAL | Status: DC | PRN

## 2018-08-11 MED ORDER — IBUPROFEN 600 MG PO TABS
600.0000 mg | ORAL_TABLET | Freq: Four times a day (QID) | ORAL | Status: DC
  Administered 2018-08-11 – 2018-08-13 (×5): 600 mg via ORAL
  Filled 2018-08-11 (×6): qty 1

## 2018-08-11 MED ORDER — OXYTOCIN 10 UNIT/ML IJ SOLN
INTRAMUSCULAR | Status: AC
  Filled 2018-08-11: qty 2

## 2018-08-11 MED ORDER — SODIUM CHLORIDE 0.9 % IV SOLN
INTRAVENOUS | Status: DC | PRN
  Administered 2018-08-11 (×2): 5 mL via EPIDURAL

## 2018-08-11 MED ORDER — ONDANSETRON HCL 4 MG PO TABS: 4.0000 mg | ORAL_TABLET | ORAL | Status: DC | PRN

## 2018-08-11 NOTE — Progress Notes (Signed)
Report to Dione Plover, RN - RN assumed care during placement of epidural with Dr. Karlton Lemon

## 2018-08-11 NOTE — Anesthesia Procedure Notes (Signed)
Epidural Patient location during procedure: OB Start time: 08/11/2018 7:06 PM End time: 08/11/2018 7:22 PM  Staffing Anesthesiologist: Lenard SimmerKarenz, Eleina Jergens, MD Performed: anesthesiologist   Preanesthetic Checklist Completed: patient identified, site marked, surgical consent, pre-op evaluation, timeout performed, IV checked, risks and benefits discussed and monitors and equipment checked  Epidural Patient position: sitting Prep: ChloraPrep Patient monitoring: heart rate, continuous pulse ox and blood pressure Approach: midline Location: L4-L5 Injection technique: LOR saline  Needle:  Needle type: Tuohy  Needle gauge: 17 G Needle length: 9 cm and 9 Needle insertion depth: 5.5 cm Catheter type: closed end flexible Catheter size: 19 Gauge Catheter at skin depth: 11 cm Test dose: negative and 1.5% lidocaine with Epi 1:200 K  Assessment Sensory level: T10 Events: blood not aspirated, injection not painful, no injection resistance, negative IV test and no paresthesia  Additional Notes two attempts Pt. Evaluated and documentation done after procedure finished. Patient identified. Risks/Benefits/Options discussed with patient including but not limited to bleeding, infection, nerve damage, paralysis, failed block, incomplete pain control, headache, blood pressure changes, nausea, vomiting, reactions to medication both or allergic, itching and postpartum back pain. Confirmed with bedside nurse the patient's most recent platelet count. Confirmed with patient that they are not currently taking any anticoagulation, have any bleeding history or any family history of bleeding disorders. Patient expressed understanding and wished to proceed. All questions were answered. Sterile technique was used throughout the entire procedure. Please see nursing notes for vital signs. Test dose was given through epidural catheter and negative prior to continuing to dose epidural or start infusion. Warning signs of high  block given to the patient including shortness of breath, tingling/numbness in hands, complete motor block, or any concerning symptoms with instructions to call for help. Patient was given instructions on fall risk and not to get out of bed. All questions and concerns addressed with instructions to call with any issues or inadequate analgesia.   Patient tolerated the insertion well without immediate complications.Reason for block:procedure for pain

## 2018-08-11 NOTE — Discharge Summary (Addendum)
OB Discharge Summary     Patient Name: Robin French DOB: 1980-11-13 MRN: [id number]  Date of admission: 08/11/2018 Delivering Provider: Oswaldo Conroy, CNM  Date of Delivery: 08/11/2018  Date of discharge: 08/13/2018  Admitting diagnosis: Elective induction of labor Intrauterine pregnancy: [redacted]w[redacted]d     Secondary diagnosis: None     Discharge diagnosis: Term Pregnancy Delivered, Shoulder dystocia                         Hospital course:  Onset of Labor With Vaginal Delivery     38 y.o. yo [id number] at [redacted]w[redacted]d was admitted in Active Labor on 08/11/2018. Patient had a labor course as follows:  Membrane Rupture Time/Date: 7:34 PM ,08/11/2018   Intrapartum Procedures: Episiotomy: None [1]                                         Lacerations:  None [1]  Patient had a delivery of a Viable infant. 08/11/2018  Information for the patient's newborn:  Yamini, Rowekamp [[id number]]  Delivery Method: Vag-Spont Shoulder dystocia resolved by delivery of the posterior arm with vigorous baby who moved all extremities well following delivery.   Patient had an uncomplicated postpartum course.  She is ambulating, tolerating a regular diet, passing flatus, and urinating well. Patient is discharged home in stable condition on 08/13/18.                                                                  Post partum procedures:none  Complications: None  Physical exam on 08/13/2018: Vitals:   08/12/18 1532 08/12/18 1952 08/12/18 2300 08/13/18 0723  BP: 98/60 100/60 (!) 103/59 112/63  Pulse: 80 85 95 85  Resp: 20 20 16 18   Temp: 97.9 F (36.6 C)  98.1 F (36.7 C) 98.1 F (36.7 C)  TempSrc: Oral  Oral Oral  SpO2: 97% 100% 95% 100%  Weight:      Height:       General: alert, cooperative and no distress Lochia: appropriate Uterine Fundus: firm Incision: N/A DVT Evaluation: No evidence of DVT seen on physical exam. EPDS score is 15  Labs: Lab Results  Component Value Date   WBC 12.7 (H)  08/12/2018   HGB 9.4 (L) 08/12/2018   HCT 30.2 (L) 08/12/2018   MCV 88.0 08/12/2018   PLT 145 (L) 08/12/2018   CMP Latest Ref Rng & Units 02/11/2018  Glucose 70 - 99 mg/dL 740(C)  BUN 6 - 20 mg/dL 10  Creatinine 1.44 - 8.18 mg/dL 5.63  Sodium 149 - 702 mmol/L 138  Potassium 3.5 - 5.1 mmol/L 3.5  Chloride 98 - 111 mmol/L 106  CO2 22 - 32 mmol/L 26  Calcium 8.9 - 10.3 mg/dL 8.9  Total Protein 6.5 - 8.1 g/dL 7.2  Total Bilirubin 0.3 - 1.2 mg/dL 0.3  Alkaline Phos 38 - 126 U/L 51  AST 15 - 41 U/L 18  ALT 0 - 44 U/L 14    Discharge instruction: per After Visit Summary.  Medications:  Allergies as of 08/13/2018   No Known Allergies     Medication List  TAKE these medications   oxyCODONE-acetaminophen 5-325 MG tablet Commonly known as:  PERCOCET/ROXICET Take 1 tablet by mouth every 6 (six) hours as needed (pain scale 4-7).   PROVIDA DHA 16-16-1.25-110 MG Caps Take 1 capsule by mouth daily.   sertraline 50 MG tablet Commonly known as:  ZOLOFT Take 1 tablet (50 mg total) by mouth daily.       Diet: routine diet  Activity: Advance as tolerated. Pelvic rest for 6 weeks.   Outpatient follow up: Follow-up Information    Oswaldo ConroySchmid, Jacelyn Y, CNM. Schedule an appointment as soon as possible for a visit in 6 week(s).   Specialty:  Certified Nurse Midwife Contact information: [address] [place] [address] [phone]        Conard NovakJackson, Stephen D, MD. Schedule an appointment as soon as possible for a visit in 2 week(s).   Specialty:  Obstetrics and Gynecology Why:  to discuss postpartum tubal ligation planning, please discuss medication for postpartum depression Contact information: [address] [place] [address] [phone]             Postpartum contraception: Interval Tubal Ligation Rhogam Given postpartum: no Rubella vaccine given postpartum: no Varicella vaccine given postpartum: no TDaP given antepartum or postpartum:  Yes  Newborn Data: Live born female  Birth Weight: 7 lb 8.3 oz (3410 g) APGAR: 8, 9  Newborn Delivery   Time head delivered:  08/11/2018 19:47:00 Birth date/time:  08/11/2018 19:48:00 Delivery type:  Vaginal, Spontaneous      Baby Feeding: Formula  Disposition:home with mother  SIGNED:  Tresea MallJane Sharma Lawrance, CNM 08/13/2018 3:04 PM

## 2018-08-11 NOTE — Progress Notes (Signed)
   08/11/18 1000  Clinical Encounter Type  Visited With Patient  Visit Type Initial  Referral From Nurse  Consult/Referral To Chaplain  Chaplain received a OR for AD. Chaplain arrived and nurse introduced Chaplain to patient and ask was this a good time. Patient said yes. Chaplain educated the patient on AD. Patient a was a little aware of how the AD works. Patient said she did not have any questions but she would go over it with her husband. Chaplain told patient to have Chaplain page if she wanted to complete AD.

## 2018-08-11 NOTE — Progress Notes (Signed)
  Labor Progress Note   38 y.o. [id number] @ [redacted]w[redacted]d , admitted for  Pregnancy, Labor Management. Elective Induction of Labor.  Subjective:  Feeling more pain/pressure with contractions.  Objective:  BP (!) 110/52 (BP Location: Left Arm)   Pulse 85   Temp 99.1 F (37.3 C) (Oral)   Resp 18   Ht 5\' 2"  (1.575 m)   Wt 96.6 kg   LMP 11/04/2017 (Approximate)   BMI 38.96 kg/m  Abd: gravid, non-tender Extr: trace to 1+ bilateral pedal edema SVE: 2.5/70/-2  EFM: FHR: 135 bpm, variability: moderate,  accelerations:  Present,  decelerations:  Absent Toco: Frequency: Every 2-4 minutes, Duration:40-50 seconds and Intensity: mild to moderate Labs: I have reviewed the patient's lab results.   Assessment & Plan:  [id number] @ [redacted]w[redacted]d, admitted for  Pregnancy and Labor/Delivery Management, induction of labor  1. Pain management: Discussed IV pain medication/epidural/comfort measures. She wants to try a heating pad for now and will let us know when she is ready for medications 2. FWB: FHT category I.  3. ID: GBS negative 4. Labor management: Start IV Pitocin and titrate for adequate contractions.  All discussed with patient, see orders.  Marcelyn Bruins, CNM 08/11/2018  5:26 PM

## 2018-08-11 NOTE — H&P (Signed)
History and Physical Interval Note:  08/11/2018 9:38 AM  Robin French  has presented today for INDUCTION OF LABOR, with the diagnosis of Favorable cervix at term. The various methods of treatment have been discussed with the patient and family. After consideration of risks, benefits and other options for treatment, the patient has consented to a labor induction. The patient's history has been reviewed, patient examined, no change in status, and is stable for induction as planned.  See H&P on 08/08/2018. I have reviewed the patient's chart and labs. Questions were answered to the patient's satisfaction.    Cervical exam: 1/50-60/-2  EFM: Baseline 145 bpm, accelerations present, decelerations absent. Category I tracing. Toco: Occasional contractions.   Begin induction with Cytotec.  Marcelyn Bruins, CNM 08/11/2018

## 2018-08-11 NOTE — Anesthesia Preprocedure Evaluation (Signed)
Anesthesia Evaluation  Patient identified by MRN, date of birth, ID band Patient awake    Reviewed: Allergy & Precautions, H&P , NPO status , Patient's Chart, lab work & pertinent test results, reviewed documented beta blocker date and time   History of Anesthesia Complications Negative for: history of anesthetic complications  Airway Mallampati: II  TM Distance: >3 FB Neck ROM: full    Dental no notable dental hx.    Pulmonary neg shortness of breath, asthma , neg COPD, neg recent URI,           Cardiovascular Exercise Tolerance: Good negative cardio ROS       Neuro/Psych PSYCHIATRIC DISORDERS Depression negative neurological ROS     GI/Hepatic Neg liver ROS, GERD  ,  Endo/Other  negative endocrine ROS  Renal/GU negative Renal ROS  negative genitourinary   Musculoskeletal   Abdominal   Peds  Hematology negative hematology ROS (+)   Anesthesia Other Findings Past Medical History: No date: Asthma No date: Depression   Reproductive/Obstetrics (+) Pregnancy                             Anesthesia Physical Anesthesia Plan  ASA: II  Anesthesia Plan: Epidural   Post-op Pain Management:    Induction:   PONV Risk Score and Plan:   Airway Management Planned:   Additional Equipment:   Intra-op Plan:   Post-operative Plan:   Informed Consent: I have reviewed the patients History and Physical, chart, labs and discussed the procedure including the risks, benefits and alternatives for the proposed anesthesia with the patient or authorized representative who has indicated his/her understanding and acceptance.     Dental Advisory Given  Plan Discussed with: Anesthesiologist, CRNA and Surgeon  Anesthesia Plan Comments:         Anesthesia Quick Evaluation

## 2018-08-12 ENCOUNTER — Encounter
Admission: RE | Disposition: A | Discharge status: Home / Self Care | Payer: Self-pay | Source: Home / Self Care | Attending: Maternal Newborn

## 2018-08-12 ENCOUNTER — Encounter: Payer: Self-pay | Admitting: Certified Nurse Midwife

## 2018-08-12 LAB — CBC
HCT: 30.2 % — ABNORMAL LOW (ref 36.0–46.0)
Hemoglobin: 9.4 g/dL — ABNORMAL LOW (ref 12.0–15.0)
MCH: 27.4 pg (ref 26.0–34.0)
MCHC: 31.1 g/dL (ref 30.0–36.0)
MCV: 88 fL (ref 80.0–100.0)
Platelets: 145 10*3/uL — ABNORMAL LOW (ref 150–400)
RBC: 3.43 MIL/uL — ABNORMAL LOW (ref 3.87–5.11)
RDW: 13.8 % (ref 11.5–15.5)
WBC: 12.7 10*3/uL — AB (ref 4.0–10.5)
nRBC: 0 % (ref 0.0–0.2)

## 2018-08-12 LAB — RPR: RPR Ser Ql: NONREACTIVE

## 2018-08-12 SURGERY — LIGATION, FALLOPIAN TUBE, POSTPARTUM
Anesthesia: Choice | Laterality: Bilateral

## 2018-08-12 NOTE — Progress Notes (Signed)
Patient prefers entire tube removed.  Discussed increased bleeding risk with salpingectomy as part of a postpartum BTL.  She would like this done was an interval procedure, instead. Surgery canceled.  [name], MD, [facility], [facility] 08/12/2018 10:01 AM

## 2018-08-12 NOTE — Progress Notes (Signed)
Post Partum Day 1 Subjective: Doing well. Having some back pain at epidural site. She would like a heating pad. Her pain is otherwise controlled with PO pain medications. Tolerating regular diet.  Voiding and ambulating without difficulty. Patient has had discussion with Dr Jean Rosenthal regarding tubal ligation. Surgery canceled for today and will have interval tubal due to patient requesting complete removal of fallopian tubes.   No CP SOB F/C N/V or leg pain No HA, change of vision, RUQ/epigastric pain  Objective: BP 102/65 (BP Location: Left Arm)   Pulse 72   Temp 98.2 F (36.8 C) (Oral)   Resp 16   Ht 5\' 2"  (1.575 m)   Wt 96.6 kg   LMP 11/04/2017 (Approximate)   SpO2 98%  BMI 38.96 kg/m    Physical Exam:  General: NAD CV: RRR Pulm: nl effort, CTABL Lochia: moderate Uterine Fundus: fundus firm and below umbilicus DVT Evaluation: no cords, ttp LEs   Recent Labs    08/11/18 0843 08/12/18 0636  HGB 10.0* 9.4*  HCT 31.5* 30.2*  WBC 10.1 12.7*  PLT 160 145*    Assessment/Plan: 37 y.o. [id number] postpartum day # 1  1. Continue routine postpartum care 2. O positive, Rubella Immune, Varicella Immune 3. TDAP given antepartum 4. Formula feeding 5. Contraception: interval tubal 6. Disposition: Discharge to home tomorrow   Tresea Mall, CNM

## 2018-08-13 MED ORDER — OXYCODONE-ACETAMINOPHEN 5-325 MG PO TABS: 1.0000 | ORAL_TABLET | Freq: Four times a day (QID) | ORAL | 0 refills | Status: DC | PRN

## 2018-08-13 MED ORDER — SERTRALINE HCL 50 MG PO TABS: 50.0000 mg | ORAL_TABLET | Freq: Every day | ORAL | 1 refills | Status: DC

## 2018-08-13 NOTE — Anesthesia Postprocedure Evaluation (Signed)
Anesthesia Post Note  Patient: Robin French Laws  Procedure(s) Performed: AN AD HOC LABOR EPIDURAL  Patient location during evaluation: Mother Baby Anesthesia Type: Epidural Level of consciousness: awake and alert Pain management: pain level controlled Vital Signs Assessment: post-procedure vital signs reviewed and stable Respiratory status: spontaneous breathing, nonlabored ventilation and respiratory function stable Cardiovascular status: stable Postop Assessment: no headache, no backache and epidural receding Anesthetic complications: no     Last Vitals:  Vitals:   08/12/18 2300 08/13/18 0723  BP: (!) 103/59 112/63  Pulse: 95 85  Resp: 16 18  Temp: 36.7 C 36.7 C  SpO2: 95% 100%    Last Pain:  Vitals:   08/13/18 0723  TempSrc: Oral  PainSc:                  Karoline Caldwell

## 2018-08-13 NOTE — Progress Notes (Signed)
Social Worker notified and will call pt in the am.  NMW will call in Rx for antidepressant for home use and will f/u with MD next week.

## 2018-08-13 NOTE — Progress Notes (Signed)
Pt verb u/o of plan with antidepressant.

## 2018-08-29 ENCOUNTER — Telehealth: Payer: Self-pay | Admitting: Obstetrics and Gynecology

## 2018-08-29 ENCOUNTER — Encounter: Payer: Self-pay | Admitting: Obstetrics and Gynecology

## 2018-08-29 ENCOUNTER — Ambulatory Visit (INDEPENDENT_AMBULATORY_CARE_PROVIDER_SITE_OTHER): Payer: Medicaid Other | Admitting: Obstetrics and Gynecology

## 2018-08-29 VITALS — BP 100/70 | Ht 62.0 in | Wt 194.0 lb

## 2018-08-29 DIAGNOSIS — Z308 Encounter for other contraceptive management: Secondary | ICD-10-CM

## 2018-08-29 NOTE — Telephone Encounter (Signed)
-----   Message from Conard Novak, MD sent at 08/29/2018 12:26 PM EST ----- Regarding: Schedule Surgery Surgery Booking Request Patient Full Name:  Jeneva Dragovich  MRN: [id number]  DOB: 1981/05/12  Surgeon: [name], MD  Requested Surgery Date and Time: TBD (next ~3 weeks) Primary Diagnosis AND Code: desires permanent sterilization Secondary Diagnosis and Code:  Surgical Procedure: bilateral tubal ligation with incidental bilateral salpingectomy L&D Notification: No Admission Status: same day surgery Length of Surgery: 30 minutes Special Case Needs: laparoscopic ligasure 60mm H&P: TBD (date) Phone Interview???: yes Interpreter: Language:  Medical Clearance: no Special Scheduling Instructions: may perform consents day of surgery

## 2018-08-29 NOTE — Progress Notes (Signed)
Obstetrics & Gynecology Office Visit   Chief Complaint  Patient presents with  . Consult    History of Present Illness: Patient is a 38 y.o. [id number] presenting for contraception consult.  She is currently on nothing for contraception and desiring to start tubal ligation.  She has a past medical history significant for no contraindication to estrogen.  She specifically denies a history of migraine with aura, chronic hypertension, history of DVT/PE and smoking.  Reported Patient's last menstrual period was 11/04/2017 (approximate).   Past Medical History:  Diagnosis Date  . Asthma   . Depression     Past Surgical History:  Procedure Laterality Date  . deviated septum repair    . GANGLION CYST EXCISION    . TONSILLECTOMY AND ADENOIDECTOMY     PART ADENOID  . tubes in ears    . WISDOM TOOTH EXTRACTION     BOTTOM TWO    Gynecologic History: Patient's last menstrual period was 11/04/2017 (approximate).  Obstetric History: [id number]  Family History  Problem Relation Age of Onset  . Diabetes Maternal Grandmother   . Heart disease Maternal Grandfather   . Diabetes Paternal Grandmother   . Alzheimer's disease Paternal Grandmother   . Cancer Other        OVARIAN    Social History   Socioeconomic History  . Marital status: Married    Spouse name: Not on file  . Number of children: 3  . Years of education: 71  . Highest education level: Not on file  Occupational History  . Occupation: STAY AT HOME MOM  Social Needs  . Financial resource strain: Not on file  . Food insecurity:    Worry: Not on file    Inability: Not on file  . Transportation needs:    Medical: Not on file    Non-medical: Not on file  Tobacco Use  . Smoking status: Never Smoker  . Smokeless tobacco: Never Used  Substance and Sexual Activity  . Alcohol use: Not Currently  . Drug use: Never  . Sexual activity: Yes    Birth control/protection: None, Surgical    Comment: BTL  Lifestyle  . Physical  activity:    Days per week: Not on file    Minutes per session: Not on file  . Stress: Not on file  Relationships  . Social connections:    Talks on phone: Not on file    Gets together: Not on file    Attends religious service: Not on file    Active member of club or organization: Not on file    Attends meetings of clubs or organizations: Not on file    Relationship status: Not on file  . Intimate partner violence:    Fear of current or ex partner: Not on file    Emotionally abused: Not on file    Physically abused: Not on file    Forced sexual activity: Not on file  Other Topics Concern  . Not on file  Social History Narrative  . Not on file    No Known Allergies  Prior to Admission medications   Medication Sig Start Date End Date Taking? Authorizing Provider  oxyCODONE-acetaminophen (PERCOCET/ROXICET) 5-325 MG tablet Take 1 tablet by mouth every 6 (six) hours as needed (pain scale 4-7). Patient not taking: Reported on 08/29/2018 08/13/18   Tresea Mall, CNM  Prenat-FeFum-FePo-FA-DHA w/o A (PROVIDA DHA) 16-16-1.25-110 MG CAPS Take 1 capsule by mouth daily. Patient not taking: Reported on 08/29/2018 01/03/18   Velora Mediate  P, MD  sertraline (ZOLOFT) 50 MG tablet Take 1 tablet (50 mg total) by mouth daily. Patient not taking: Reported on 08/29/2018 08/13/18   Gledhill, Jane, CNM    Review of Systems  Constitutional: Negative.   HENT: Negative.   Eyes: Negative.   Respiratory: Negative.   Cardiovascular: Negative.   Gastrointestinal: Negative.   Genitourinary: Negative.   Musculoskeletal: Negative.   Skin: Negative.   Neurological: Negative.   Psychiatric/Behavioral: Negative.      Physical Exam BP 100/70   Ht 5' 2" (1.575 m)   Wt 194 lb (88 kg)   LMP 11/04/2017 (Approximate)   BMI 35.48 kg/m  Patient's last menstrual period was 11/04/2017 (approximate). Physical Exam Constitutional:      General: She is not in acute distress.    Appearance: Normal appearance.    HENT:     Head: Normocephalic and atraumatic.  Eyes:     General: No scleral icterus.    Conjunctiva/sclera: Conjunctivae normal.  Neurological:     General: No focal deficit present.     Mental Status: She is alert and oriented to person, place, and time.     Cranial Nerves: No cranial nerve deficit.  Psychiatric:        Mood and Affect: Mood normal.        Behavior: Behavior normal.        Judgment: Judgment normal.     Assessment: 37 y.o. G5P4014 female here for  1. Encounter for other contraceptive management      Plan: Problem List Items Addressed This Visit    None    Visit Diagnoses    Encounter for other contraceptive management    -  Primary     37 y.o. G5P4014  with undesired fertility, desires permanent sterilization.  Other reversible forms of contraception were discussed with patient; she declines all other modalities. Permanent nature of as well as associated risks of the procedure discussed with patient including but not limited to: risk of regret, permanence of method, bleeding, infection, injury to surrounding organs and need for additional procedures.  Failure risk of 0.5-1% with increased risk of ectopic gestation if pregnancy occurs was also discussed with patient.  She wishes to proceed with bilateral tubal ligation.  Her preference would be to have her tubes removed completely, if possible.  Discussed the risks and benefits of this particular method.  She voiced understanding and would like to proceed.  We will schedule for about 5 to 6 weeks out from her date of delivery.  15 minutes spent in face to face discussion with > 50% spent in counseling,management, and coordination of care of her contraceptive counseling for bilateral tubal ligation.  Dottie Vaquerano, MD 08/29/2018 2:00 PM    

## 2018-08-29 NOTE — H&P (View-Only) (Signed)
Obstetrics & Gynecology Office Visit   Chief Complaint  Patient presents with  . Consult    History of Present Illness: Patient is a 38 y.o. [id number] presenting for contraception consult.  She is currently on nothing for contraception and desiring to start tubal ligation.  She has a past medical history significant for no contraindication to estrogen.  She specifically denies a history of migraine with aura, chronic hypertension, history of DVT/PE and smoking.  Reported Patient's last menstrual period was 11/04/2017 (approximate).   Past Medical History:  Diagnosis Date  . Asthma   . Depression     Past Surgical History:  Procedure Laterality Date  . deviated septum repair    . GANGLION CYST EXCISION    . TONSILLECTOMY AND ADENOIDECTOMY     PART ADENOID  . tubes in ears    . WISDOM TOOTH EXTRACTION     BOTTOM TWO    Gynecologic History: Patient's last menstrual period was 11/04/2017 (approximate).  Obstetric History: [id number]  Family History  Problem Relation Age of Onset  . Diabetes Maternal Grandmother   . Heart disease Maternal Grandfather   . Diabetes Paternal Grandmother   . Alzheimer's disease Paternal Grandmother   . Cancer Other        OVARIAN    Social History   Socioeconomic History  . Marital status: Married    Spouse name: Not on file  . Number of children: 3  . Years of education: 71  . Highest education level: Not on file  Occupational History  . Occupation: STAY AT HOME MOM  Social Needs  . Financial resource strain: Not on file  . Food insecurity:    Worry: Not on file    Inability: Not on file  . Transportation needs:    Medical: Not on file    Non-medical: Not on file  Tobacco Use  . Smoking status: Never Smoker  . Smokeless tobacco: Never Used  Substance and Sexual Activity  . Alcohol use: Not Currently  . Drug use: Never  . Sexual activity: Yes    Birth control/protection: None, Surgical    Comment: BTL  Lifestyle  . Physical  activity:    Days per week: Not on file    Minutes per session: Not on file  . Stress: Not on file  Relationships  . Social connections:    Talks on phone: Not on file    Gets together: Not on file    Attends religious service: Not on file    Active member of club or organization: Not on file    Attends meetings of clubs or organizations: Not on file    Relationship status: Not on file  . Intimate partner violence:    Fear of current or ex partner: Not on file    Emotionally abused: Not on file    Physically abused: Not on file    Forced sexual activity: Not on file  Other Topics Concern  . Not on file  Social History Narrative  . Not on file    No Known Allergies  Prior to Admission medications   Medication Sig Start Date End Date Taking? Authorizing Provider  oxyCODONE-acetaminophen (PERCOCET/ROXICET) 5-325 MG tablet Take 1 tablet by mouth every 6 (six) hours as needed (pain scale 4-7). Patient not taking: Reported on 08/29/2018 08/13/18   Tresea Mall, CNM  Prenat-FeFum-FePo-FA-DHA w/o A (PROVIDA DHA) 16-16-1.25-110 MG CAPS Take 1 capsule by mouth daily. Patient not taking: Reported on 08/29/2018 01/03/18   Velora Mediate  P, MD  sertraline (ZOLOFT) 50 MG tablet Take 1 tablet (50 mg total) by mouth daily. Patient not taking: Reported on 08/29/2018 08/13/18   Tresea MallGledhill, Jane, CNM    Review of Systems  Constitutional: Negative.   HENT: Negative.   Eyes: Negative.   Respiratory: Negative.   Cardiovascular: Negative.   Gastrointestinal: Negative.   Genitourinary: Negative.   Musculoskeletal: Negative.   Skin: Negative.   Neurological: Negative.   Psychiatric/Behavioral: Negative.      Physical Exam BP 100/70   Ht 5\' 2"  (1.575 m)   Wt 194 lb (88 kg)   LMP 11/04/2017 (Approximate)   BMI 35.48 kg/m  Patient's last menstrual period was 11/04/2017 (approximate). Physical Exam Constitutional:      General: She is not in acute distress.    Appearance: Normal appearance.    HENT:     Head: Normocephalic and atraumatic.  Eyes:     General: No scleral icterus.    Conjunctiva/sclera: Conjunctivae normal.  Neurological:     General: No focal deficit present.     Mental Status: She is alert and oriented to person, place, and time.     Cranial Nerves: No cranial nerve deficit.  Psychiatric:        Mood and Affect: Mood normal.        Behavior: Behavior normal.        Judgment: Judgment normal.     Assessment: 38 y.o. [id number] female here for  1. Encounter for other contraceptive management      Plan: Problem List Items Addressed This Visit    None    Visit Diagnoses    Encounter for other contraceptive management    -  Primary     38 y.o. [id number]  with undesired fertility, desires permanent sterilization.  Other reversible forms of contraception were discussed with patient; she declines all other modalities. Permanent nature of as well as associated risks of the procedure discussed with patient including but not limited to: risk of regret, permanence of method, bleeding, infection, injury to surrounding organs and need for additional procedures.  Failure risk of 0.5-1% with increased risk of ectopic gestation if pregnancy occurs was also discussed with patient.  She wishes to proceed with bilateral tubal ligation.  Her preference would be to have her tubes removed completely, if possible.  Discussed the risks and benefits of this particular method.  She voiced understanding and would like to proceed.  We will schedule for about 5 to 6 weeks out from her date of delivery.  15 minutes spent in face to face discussion with > 50% spent in counseling,management, and coordination of care of her contraceptive counseling for bilateral tubal ligation.  Thomasene MohairStephen Jackson, MD 08/29/2018 2:00 PM

## 2018-08-29 NOTE — Telephone Encounter (Signed)
Patient is aware of H&P on day of surgery, Pre-admit Testing phone interview to be scheduled (she will watch for the notification on MyChart), and OR on 09/18/18. Patient is aware she may receive calls from the [facility] and [facility]. Patient confirmed Medicaid and no additional insurance. Patient was holding the baby and could not write down the phone# to Same Day Surgery but will ask when she receives the call from Pre-admit for the phone interview. Patient is aware she should have someone drive her on day of surgery and said her husband will be there.

## 2018-09-04 ENCOUNTER — Inpatient Hospital Stay: Admission: RE | Admit: 2018-09-04 | Payer: Medicaid Other | Source: Ambulatory Visit

## 2018-09-05 ENCOUNTER — Inpatient Hospital Stay: Admission: RE | Admit: 2018-09-05 | Payer: Medicaid Other | Source: Ambulatory Visit

## 2018-09-08 ENCOUNTER — Other Ambulatory Visit: Payer: Self-pay

## 2018-09-08 ENCOUNTER — Encounter
Admission: RE | Admit: 2018-09-08 | Discharge: 2018-09-08 | Disposition: A | Discharge status: Home / Self Care | Payer: Medicaid Other | Source: Ambulatory Visit | Attending: Obstetrics and Gynecology | Admitting: Obstetrics and Gynecology

## 2018-09-08 DIAGNOSIS — Z01812 Encounter for preprocedural laboratory examination: Secondary | ICD-10-CM | POA: Insufficient documentation

## 2018-09-08 HISTORY — DX: Headache, unspecified: R51.9

## 2018-09-08 HISTORY — DX: Gastro-esophageal reflux disease without esophagitis: K21.9

## 2018-09-08 HISTORY — DX: Headache: R51

## 2018-09-08 NOTE — Patient Instructions (Signed)
Your procedure is scheduled on: 09-18-18 THURSDAY Report to Same Day Surgery 2nd floor medical mall [facility] [place] Entrance-take elevator on left to 2nd floor.  Check in with surgery information desk.) To find out your arrival time please call [phone] between 1PM - 3PM on 09-17-18 [facility]  Remember: Instructions that are not followed completely may result in serious medical risk, up to and including death, or upon the discretion of your surgeon and anesthesiologist your surgery may need to be rescheduled.    _x___ 1. Do not eat food after midnight the night before your procedure. NO GUM OR CANDY AFTER MIDNIGHT.  You may drink clear liquids up to 2 hours before you are scheduled to arrive at the hospital for your procedure.  Do not drink clear liquids within 2 hours of your scheduled arrival to the hospital.  Clear liquids include  --Water or Apple juice without pulp  --Clear carbohydrate beverage such as ClearFast or Gatorade  --Black Coffee or Clear Tea (No milk, no creamers, do not add anything to  the coffee or Tea   ____Ensure clear carbohydrate drink on the way to the hospital for bariatric patients  ____Ensure clear carbohydrate drink 3 hours before surgery for Dr Rutherford Nail patients if physician instructed.    __x__ 2. No Alcohol for 24 hours before or after surgery.   __x__3. No Smoking or e-cigarettes for 24 prior to surgery.  Do not use any chewable tobacco products for at least 6 hour prior to surgery   ____  4. Bring all medications with you on the day of surgery if instructed.    __x__ 5. Notify your doctor if there is any change in your medical condition     (cold, fever, infections).    x___6. On the morning of surgery brush your teeth with toothpaste and water.  You may rinse your mouth with mouth wash if you wish.  Do not swallow any toothpaste or mouthwash.   Do not wear jewelry, make-up, hairpins, clips or nail polish.  Do not wear lotions, powders, or  perfumes. You may wear deodorant.  Do not shave 48 hours prior to surgery. Men may shave face and neck.  Do not bring valuables to the hospital.    [facility] is not responsible for any belongings or valuables.               Contacts, dentures or bridgework may not be worn into surgery.  Leave your suitcase in the car. After surgery it may be brought to your room.  For patients admitted to the hospital, discharge time is determined by your treatment team.  _  Patients discharged the day of surgery will not be allowed to drive home.  You will need someone to drive you home and stay with you the night of your procedure.    Please read over the following fact sheets that you were given:   [facility] Preparing for Surgery   _x___ TAKE THE FOLLOWING MEDICATION THE MORNING OF SURGERY WITH A SMALL SIP OF WATER. These include:  1. ZOLOFT (SERTRALINE)  2.  3.  4.  5.  6.  ____Fleets enema or Magnesium Citrate as directed.   _x___ Use CHG Soap or sage wipes as directed on instruction sheet   ____ Use inhalers on the day of surgery and bring to hospital day of surgery  ____ Stop Metformin and Janumet 2 days prior to surgery.    ____ Take 1/2 of usual insulin dose the night  before surgery and none on the morning surgery.   ____ Follow recommendations from Cardiologist, Pulmonologist or PCP regarding stopping Aspirin, Coumadin, Plavix ,Eliquis, Effient, or Pradaxa, and Pletal.  X____Stop Anti-inflammatories such as Advil, Aleve, Ibuprofen, Motrin, Naproxen, Naprosyn, Goodies powders or aspirin products 7 DAYS PRIOR TO SURGERY-  OK to take Tylenol   ____ Stop supplements until after surgery.    ____ Bring C-Pap to the hospital.

## 2018-09-09 ENCOUNTER — Encounter
Admission: RE | Admit: 2018-09-09 | Discharge: 2018-09-09 | Disposition: A | Discharge status: Home / Self Care | Payer: Medicaid Other | Source: Ambulatory Visit | Attending: Obstetrics and Gynecology | Admitting: Obstetrics and Gynecology

## 2018-09-09 DIAGNOSIS — Z01812 Encounter for preprocedural laboratory examination: Secondary | ICD-10-CM | POA: Diagnosis not present

## 2018-09-09 LAB — HEMOGLOBIN: Hemoglobin: 12 g/dL (ref 12.0–15.0)

## 2018-09-18 ENCOUNTER — Encounter: Payer: Self-pay | Admitting: *Deleted

## 2018-09-18 ENCOUNTER — Ambulatory Visit: Payer: Medicaid Other | Admitting: Certified Registered Nurse Anesthetist

## 2018-09-18 ENCOUNTER — Encounter
Admission: RE | Disposition: A | Discharge status: Home / Self Care | Payer: Self-pay | Source: Home / Self Care | Attending: Obstetrics and Gynecology

## 2018-09-18 ENCOUNTER — Ambulatory Visit
Admission: RE | Admit: 2018-09-18 | Discharge: 2018-09-18 | Disposition: A | Discharge status: Home / Self Care | Payer: Medicaid Other | Attending: Obstetrics and Gynecology | Admitting: Obstetrics and Gynecology

## 2018-09-18 ENCOUNTER — Other Ambulatory Visit: Payer: Self-pay

## 2018-09-18 DIAGNOSIS — N838 Other noninflammatory disorders of ovary, fallopian tube and broad ligament: Secondary | ICD-10-CM | POA: Diagnosis not present

## 2018-09-18 DIAGNOSIS — Z302 Encounter for sterilization: Secondary | ICD-10-CM | POA: Diagnosis not present

## 2018-09-18 HISTORY — PX: LAPAROSCOPIC BILATERAL SALPINGECTOMY: SHX5889

## 2018-09-18 HISTORY — PX: LAPAROSCOPIC TUBAL LIGATION: SHX1937

## 2018-09-18 LAB — POCT PREGNANCY, URINE: Preg Test, Ur: NEGATIVE

## 2018-09-18 SURGERY — LIGATION, FALLOPIAN TUBE, LAPAROSCOPIC
Anesthesia: General | Laterality: Bilateral

## 2018-09-18 MED ORDER — ROCURONIUM BROMIDE 100 MG/10ML IV SOLN
INTRAVENOUS | Status: DC | PRN
  Administered 2018-09-18: 40 mg via INTRAVENOUS

## 2018-09-18 MED ORDER — LIDOCAINE HCL 4 % MT SOLN
OROMUCOSAL | Status: DC | PRN
  Administered 2018-09-18: 4 mL via TOPICAL

## 2018-09-18 MED ORDER — DEXAMETHASONE SODIUM PHOSPHATE 10 MG/ML IJ SOLN
INTRAMUSCULAR | Status: DC | PRN
  Administered 2018-09-18: 4 mg via INTRAVENOUS

## 2018-09-18 MED ORDER — SILVER NITRATE-POT NITRATE 75-25 % EX MISC
CUTANEOUS | Status: DC | PRN
  Administered 2018-09-18: 1

## 2018-09-18 MED ORDER — SUGAMMADEX SODIUM 200 MG/2ML IV SOLN
INTRAVENOUS | Status: DC | PRN
  Administered 2018-09-18: 200 mg via INTRAVENOUS

## 2018-09-18 MED ORDER — MIDAZOLAM HCL 2 MG/2ML IJ SOLN
INTRAMUSCULAR | Status: AC
  Filled 2018-09-18: qty 2

## 2018-09-18 MED ORDER — HYDROCODONE-ACETAMINOPHEN 5-325 MG PO TABS: 1.0000 | ORAL_TABLET | Freq: Four times a day (QID) | ORAL | 0 refills | Status: DC | PRN

## 2018-09-18 MED ORDER — EPHEDRINE SULFATE 50 MG/ML IJ SOLN
INTRAMUSCULAR | Status: DC | PRN
  Administered 2018-09-18: 15 mg via INTRAVENOUS

## 2018-09-18 MED ORDER — FENTANYL CITRATE (PF) 100 MCG/2ML IJ SOLN
INTRAMUSCULAR | Status: AC
  Filled 2018-09-18: qty 2

## 2018-09-18 MED ORDER — FAMOTIDINE 20 MG PO TABS
20.0000 mg | ORAL_TABLET | Freq: Once | ORAL | Status: AC
  Administered 2018-09-18: 20 mg via ORAL

## 2018-09-18 MED ORDER — PROPOFOL 10 MG/ML IV BOLUS
INTRAVENOUS | Status: DC | PRN
  Administered 2018-09-18: 150 mg via INTRAVENOUS

## 2018-09-18 MED ORDER — PROPOFOL 10 MG/ML IV BOLUS
INTRAVENOUS | Status: AC
  Filled 2018-09-18: qty 20

## 2018-09-18 MED ORDER — FENTANYL CITRATE (PF) 100 MCG/2ML IJ SOLN: 25.0000 ug | INTRAMUSCULAR | Status: DC | PRN

## 2018-09-18 MED ORDER — MIDAZOLAM HCL 2 MG/2ML IJ SOLN
INTRAMUSCULAR | Status: DC | PRN
  Administered 2018-09-18: 2 mg via INTRAVENOUS

## 2018-09-18 MED ORDER — BUPIVACAINE HCL 0.5 % IJ SOLN
INTRAMUSCULAR | Status: DC | PRN
  Administered 2018-09-18: 8 mL

## 2018-09-18 MED ORDER — IBUPROFEN 600 MG PO TABS: 600.0000 mg | ORAL_TABLET | Freq: Four times a day (QID) | ORAL | 0 refills | Status: DC | PRN

## 2018-09-18 MED ORDER — LACTATED RINGERS IV SOLN
INTRAVENOUS | Status: DC
  Administered 2018-09-18: 14:00:00 via INTRAVENOUS

## 2018-09-18 MED ORDER — PHENYLEPHRINE HCL 10 MG/ML IJ SOLN
INTRAMUSCULAR | Status: DC | PRN
  Administered 2018-09-18: 100 ug via INTRAVENOUS

## 2018-09-18 MED ORDER — SEVOFLURANE IN SOLN
RESPIRATORY_TRACT | Status: AC
  Filled 2018-09-18: qty 250

## 2018-09-18 MED ORDER — ACETAMINOPHEN NICU IV SYRINGE 10 MG/ML
INTRAVENOUS | Status: AC
  Filled 2018-09-18: qty 1

## 2018-09-18 MED ORDER — ACETAMINOPHEN 10 MG/ML IV SOLN
INTRAVENOUS | Status: DC | PRN
  Administered 2018-09-18: 1000 mg via INTRAVENOUS

## 2018-09-18 MED ORDER — HYDROCODONE-ACETAMINOPHEN 5-325 MG PO TABS
ORAL_TABLET | ORAL | Status: AC
  Filled 2018-09-18: qty 1

## 2018-09-18 MED ORDER — FAMOTIDINE 20 MG PO TABS
ORAL_TABLET | ORAL | Status: AC
  Filled 2018-09-18: qty 1

## 2018-09-18 MED ORDER — FENTANYL CITRATE (PF) 100 MCG/2ML IJ SOLN
INTRAMUSCULAR | Status: DC | PRN
  Administered 2018-09-18 (×2): 50 ug via INTRAVENOUS

## 2018-09-18 MED ORDER — ONDANSETRON HCL 4 MG/2ML IJ SOLN
INTRAMUSCULAR | Status: DC | PRN
  Administered 2018-09-18: 4 mg via INTRAVENOUS

## 2018-09-18 MED ORDER — LACTATED RINGERS IV SOLN: INTRAVENOUS | Status: DC

## 2018-09-18 MED ORDER — LIDOCAINE HCL (CARDIAC) PF 100 MG/5ML IV SOSY
PREFILLED_SYRINGE | INTRAVENOUS | Status: DC | PRN
  Administered 2018-09-18: 70 mg via INTRAVENOUS

## 2018-09-18 MED ORDER — BUPIVACAINE HCL (PF) 0.5 % IJ SOLN
INTRAMUSCULAR | Status: AC
  Filled 2018-09-18: qty 30

## 2018-09-18 MED ORDER — ONDANSETRON HCL 4 MG/2ML IJ SOLN: 4.0000 mg | Freq: Once | INTRAMUSCULAR | Status: DC | PRN

## 2018-09-18 MED ORDER — HYDROCODONE-ACETAMINOPHEN 5-325 MG PO TABS
1.0000 | ORAL_TABLET | Freq: Four times a day (QID) | ORAL | Status: DC | PRN
  Administered 2018-09-18: 1 via ORAL

## 2018-09-18 SURGICAL SUPPLY — 50 items
ANCHOR TIS RET SYS 235ML (MISCELLANEOUS) IMPLANT
BAG URINE DRAINAGE (UROLOGICAL SUPPLIES) ×3 IMPLANT
BLADE SURG SZ11 CARB STEEL (BLADE) ×3 IMPLANT
CANISTER SUCT 1200ML W/VALVE (MISCELLANEOUS) ×3 IMPLANT
CATH FOL 2WAY LX 16X5 (CATHETERS) ×3 IMPLANT
CATH FOLEY 2WAY  5CC 16FR (CATHETERS)
CATH URTH 16FR FL 2W BLN LF (CATHETERS) IMPLANT
CHLORAPREP W/TINT 26ML (MISCELLANEOUS) ×3 IMPLANT
COVER WAND RF STERILE (DRAPES) ×3 IMPLANT
DERMABOND ADVANCED (GAUZE/BANDAGES/DRESSINGS) ×2
DERMABOND ADVANCED .7 DNX12 (GAUZE/BANDAGES/DRESSINGS) ×1 IMPLANT
DRAPE LEGGINS SURG 28X43 STRL (DRAPES) ×3 IMPLANT
DRAPE SHEET LG 3/4 BI-LAMINATE (DRAPES) ×3 IMPLANT
DRAPE UNDER BUTTOCK W/FLU (DRAPES) ×3 IMPLANT
GLOVE BIO SURGEON STRL SZ7 (GLOVE) ×6 IMPLANT
GLOVE BIOGEL PI IND STRL 7.5 (GLOVE) ×1 IMPLANT
GLOVE BIOGEL PI INDICATOR 7.5 (GLOVE) ×2
GOWN STRL REUS W/ TWL LRG LVL3 (GOWN DISPOSABLE) ×3 IMPLANT
GOWN STRL REUS W/TWL LRG LVL3 (GOWN DISPOSABLE) ×6
GRASPER SUT TROCAR 14GX15 (MISCELLANEOUS) ×3 IMPLANT
IRRIGATION STRYKERFLOW (MISCELLANEOUS) IMPLANT
IRRIGATOR STRYKERFLOW (MISCELLANEOUS)
IV LACTATED RINGERS 1000ML (IV SOLUTION) ×3 IMPLANT
KIT PINK PAD W/HEAD ARE REST (MISCELLANEOUS) ×3
KIT PINK PAD W/HEAD ARM REST (MISCELLANEOUS) ×1 IMPLANT
KIT TURNOVER CYSTO (KITS) ×3 IMPLANT
LABEL OR SOLS (LABEL) ×3 IMPLANT
LIGASURE VESSEL 5MM BLUNT TIP (ELECTROSURGICAL) ×3 IMPLANT
MANIPULATOR UTERINE 4.5 ZUMI (MISCELLANEOUS) ×3 IMPLANT
NEEDLE HYPO 22GX1.5 SAFETY (NEEDLE) ×3 IMPLANT
NS IRRIG 500ML POUR BTL (IV SOLUTION) ×3 IMPLANT
PACK LAP CHOLECYSTECTOMY (MISCELLANEOUS) ×3 IMPLANT
PAD OB MATERNITY 4.3X12.25 (PERSONAL CARE ITEMS) ×3 IMPLANT
PAD PREP 24X41 OB/GYN DISP (PERSONAL CARE ITEMS) ×3 IMPLANT
SCISSORS METZENBAUM CVD 33 (INSTRUMENTS) IMPLANT
SET TUBE SMOKE EVAC HIGH FLOW (TUBING) ×3 IMPLANT
SLEEVE ENDOPATH XCEL 5M (ENDOMECHANICALS) ×6 IMPLANT
SOL PREP PVP 2OZ (MISCELLANEOUS) ×3
SOLUTION PREP PVP 2OZ (MISCELLANEOUS) ×1 IMPLANT
SURGILUBE 2OZ TUBE FLIPTOP (MISCELLANEOUS) ×3 IMPLANT
SUT MNCRL 3-0 UNDYED SH (SUTURE) ×1 IMPLANT
SUT MNCRL 4-0 (SUTURE) ×2
SUT MNCRL 4-0 27XMFL (SUTURE) ×1
SUT MONOCRYL 3-0 UNDYED (SUTURE) ×2
SUT VIC AB 0 CT1 36 (SUTURE) ×3 IMPLANT
SUT VIC AB 2-0 UR6 27 (SUTURE) ×3 IMPLANT
SUTURE MNCRL 4-0 27XMF (SUTURE) ×1 IMPLANT
SYR 50ML LL SCALE MARK (SYRINGE) ×3 IMPLANT
TROCAR ENDO BLADELESS 11MM (ENDOMECHANICALS) IMPLANT
TROCAR XCEL NON-BLD 5MMX100MML (ENDOMECHANICALS) ×3 IMPLANT

## 2018-09-18 NOTE — Discharge Instructions (Signed)

## 2018-09-18 NOTE — Anesthesia Preprocedure Evaluation (Signed)
Anesthesia Evaluation  Patient identified by MRN, date of birth, ID band Patient awake    Reviewed: Allergy & Precautions, H&P , NPO status , Patient's Chart, lab work & pertinent test results, reviewed documented beta blocker date and time   History of Anesthesia Complications Negative for: history of anesthetic complications  Airway Mallampati: II  TM Distance: >3 FB Neck ROM: full    Dental no notable dental hx.    Pulmonary neg shortness of breath, asthma , neg COPD, neg recent URI,           Cardiovascular Exercise Tolerance: Good negative cardio ROS       Neuro/Psych PSYCHIATRIC DISORDERS Depression negative neurological ROS     GI/Hepatic Neg liver ROS, GERD  ,  Endo/Other  negative endocrine ROS  Renal/GU negative Renal ROS  negative genitourinary   Musculoskeletal negative musculoskeletal ROS (+)   Abdominal   Peds  Hematology negative hematology ROS (+)   Anesthesia Other Findings Past Medical History: No date: Asthma No date: Depression   Reproductive/Obstetrics negative OB ROS                             Anesthesia Physical  Anesthesia Plan  ASA: II  Anesthesia Plan: General   Post-op Pain Management:    Induction: Intravenous  PONV Risk Score and Plan:   Airway Management Planned: Oral ETT  Additional Equipment:   Intra-op Plan:   Post-operative Plan: Extubation in OR  Informed Consent: I have reviewed the patients History and Physical, chart, labs and discussed the procedure including the risks, benefits and alternatives for the proposed anesthesia with the patient or authorized representative who has indicated his/her understanding and acceptance.     Dental Advisory Given  Plan Discussed with: Anesthesiologist, CRNA and Surgeon  Anesthesia Plan Comments:         Anesthesia Quick Evaluation

## 2018-09-18 NOTE — Anesthesia Post-op Follow-up Note (Signed)
Anesthesia QCDR form completed.        

## 2018-09-18 NOTE — Anesthesia Procedure Notes (Signed)
Procedure Name: Intubation Date/Time: 09/18/2018 1:59 PM Performed by: Eben Burow, CRNA Pre-anesthesia Checklist: Patient identified, Emergency Drugs available, Suction available and Patient being monitored Patient Re-evaluated:Patient Re-evaluated prior to induction Oxygen Delivery Method: Circle system utilized Preoxygenation: Pre-oxygenation with 100% oxygen Induction Type: IV induction Ventilation: Mask ventilation without difficulty Laryngoscope Size: Miller and 2 Grade View: Grade I Tube type: Oral Tube size: 7.0 mm Number of attempts: 1 Airway Equipment and Method: Stylet and LTA kit utilized Placement Confirmation: ETT inserted through vocal cords under direct vision,  positive ETCO2 and breath sounds checked- equal and bilateral Secured at: 21 cm Tube secured with: Tape Dental Injury: Teeth and Oropharynx as per pre-operative assessment

## 2018-09-18 NOTE — Op Note (Signed)
Operative Note    Pre-Op Diagnosis: multiparity, desires permanent sterility  Post-Op Diagnosis: multiparity, desires permanent sterility  Procedures: laparoscopic bilateral tubal ligation via bilateral partial salpingecotmy  Primary Surgeon: [name], MD    EBL: 1 mL  IVF: 600 mL crystalloid  Urine output: 50 mL clear urine at end of procedure  Specimens: portion of right and left fallopian tubes  Drains: none  Complications: None   Disposition: PACU   Condition: Stable   Findings: normal-appearing uterus, cervix, bilateral fallopian tubes, and ovaries  Procedure Summary:  The patient was taken to the operating room where general anesthesia was administered and found to be adequate. She was placed in the dorsal supine lithotomy position in Downs stirrups and prepped and draped in usual sterile fashion. After a timeout was called an indwelling catheter was placed in her bladder. A sterile speculum was placed in the vagina and a single-tooth tenaculum was used to grasp the anterior lip of the cervix. A Zumi uterine manipulator was affixed to the cervix in accordance with the manufacturer's recommendations. The speculum was removed from the vagina.  Attention was turned to the abdomen where after injection of local anesthetic, a 5 mm infraumbilical incision was made with the scalpel. Entry into the abdomen was obtained via Optiview trocar technique (a blunt entry technique with camera visualization through the obturator upon entry). Verification of entry into the abdomen was obtained using opening pressures. The abdomen was insufflated with CO2. The camera was introduced through the trocar with verification of atraumatic entry.  Patient placed in Trendelenburg positioning.  A 5 mm mm suprapubic incision made and accessory port advanced under direct visualization without difficulty.  The same procedure was carried out in the left lower quadrant placing a 5 mm port under direct  intra-abdominal camera visualization without difficulty.    A survey of the pelvis was undertaken with the above-noted findings.  The bilateral ureters were identified and were found to be well away from the operative area of interest. The right distal fallopian tube was elevated and, with a 5 mm Ligasure device, the mesosalpinx was cauterized and transected, leaving about a 2-3 cm section of proximal fallopian tube. The specimen was removed intact through the suprapubic port. A similar procedure was utilized to remove the left fallopian tube. The specimen was removed intact through the suprapubic port.  Hemostasis was noted along the cauterization lines.   All instruments were removed, abdomen deflated with the help of five deep breaths from anesthesia.  The trocars were removed and each port site was closed with a 4-0 monocryl in the subcutaneous layer and skin closed with skin glue.    The catheter was removed from the bladder and the Zumi uterine manipulator was removed from the vagina.  The cervix was noted to be hemostatic at the tenaculum entry sites. The vagina was verified to be clear of instruments and sponges.   The patient tolerated the procedure well.  Sponge, lap, needle, and instrument counts were correct x 2.  VTE prophylaxis: SCDs. Antibiotic prophylaxis: none indicated nor given. She was awakened in the operating room and was taken to the PACU in stable condition.   [name], MD 09/18/2018 2:45 PM

## 2018-09-18 NOTE — Transfer of Care (Signed)
Immediate Anesthesia Transfer of Care Note  Patient: Robin French  Procedure(s) Performed: LAPAROSCOPIC TUBAL LIGATION (Bilateral ) with Incidental LAPAROSCOPIC BILATERAL PORTION SALPINGECTOMY (Bilateral )  Patient Location: PACU  Anesthesia Type:General  Level of Consciousness: awake, alert , oriented and patient cooperative  Airway & Oxygen Therapy: Patient Spontanous Breathing and Patient connected to face mask oxygen  Post-op Assessment: Report given to RN and Post -op Vital signs reviewed and stable  Post vital signs: Reviewed and stable  Last Vitals:  Vitals Value Taken Time  BP 128/69 09/18/2018  2:56 PM  Temp    Pulse 88 09/18/2018  2:57 PM  Resp 13 09/18/2018  2:57 PM  SpO2 100 % 09/18/2018  2:57 PM  Vitals shown include unvalidated device data.  Last Pain:  Vitals:   09/18/18 1312  TempSrc: Tympanic  PainSc: 0-No pain         Complications: No apparent anesthesia complications

## 2018-09-18 NOTE — Interval H&P Note (Signed)
History and Physical Interval Note:  09/18/2018 1:32 PM  Robin French  has presented today for surgery, with the diagnosis of Desires Permanent Sterilization  The various methods of treatment have been discussed with the patient and family. After consideration of risks, benefits and other options for treatment, the patient has consented to  Procedure(s): LAPAROSCOPIC TUBAL LIGATION (Bilateral) with LAPAROSCOPIC BILATERAL SALPINGECTOMY, PARTIAL (Bilateral) as a surgical intervention .  The patient's history has been reviewed, patient examined, no change in status, stable for surgery.  I have reviewed the patient's chart and labs.  Questions were answered to the patient's satisfaction.  The consents have been reviewed and the patient agrees to proceed.  [name], MD, [facility], [facility] 09/18/2018 1:32 PM

## 2018-09-19 ENCOUNTER — Encounter: Payer: Self-pay | Admitting: Obstetrics and Gynecology

## 2018-09-22 ENCOUNTER — Ambulatory Visit (INDEPENDENT_AMBULATORY_CARE_PROVIDER_SITE_OTHER): Payer: Medicaid Other | Admitting: Maternal Newborn

## 2018-09-22 ENCOUNTER — Other Ambulatory Visit (HOSPITAL_COMMUNITY)
Admission: RE | Admit: 2018-09-22 | Discharge: 2018-09-22 | Disposition: A | Discharge status: Home / Self Care | Payer: Medicaid Other | Source: Ambulatory Visit | Attending: Maternal Newborn | Admitting: Maternal Newborn

## 2018-09-22 ENCOUNTER — Encounter: Payer: Self-pay | Admitting: Maternal Newborn

## 2018-09-22 DIAGNOSIS — Z1389 Encounter for screening for other disorder: Secondary | ICD-10-CM | POA: Diagnosis not present

## 2018-09-22 DIAGNOSIS — Z124 Encounter for screening for malignant neoplasm of cervix: Secondary | ICD-10-CM | POA: Diagnosis not present

## 2018-09-22 LAB — SURGICAL PATHOLOGY

## 2018-09-22 MED ORDER — SERTRALINE HCL 50 MG PO TABS: 50.0000 mg | ORAL_TABLET | Freq: Every day | ORAL | 3 refills | Status: DC

## 2018-09-22 NOTE — Anesthesia Postprocedure Evaluation (Signed)
Anesthesia Post Note  Patient: Robin French  Procedure(s) Performed: LAPAROSCOPIC TUBAL LIGATION (Bilateral ) with Incidental LAPAROSCOPIC BILATERAL PORTION SALPINGECTOMY (Bilateral )  Patient location during evaluation: PACU Anesthesia Type: General Level of consciousness: awake and alert and oriented Pain management: pain level controlled Vital Signs Assessment: post-procedure vital signs reviewed and stable Respiratory status: spontaneous breathing Cardiovascular status: blood pressure returned to baseline Anesthetic complications: no     Last Vitals:  Vitals:   09/18/18 1544 09/18/18 1637  BP: 116/76 103/62  Pulse: 80 83  Resp: 16 16  Temp:    SpO2: 100% 99%    Last Pain:  Vitals:   09/19/18 0819  TempSrc:   PainSc: 5                  Cameo,Nysir Fergusson

## 2018-09-22 NOTE — Progress Notes (Signed)
Postpartum Visit  Chief Complaint:  Chief Complaint  Patient presents with  . Postpartum Care    History of Present Illness: Patient is a 38 y.o. S1S2395 presenting for a postpartum visit.   Review the Delivery Report for details.  Date of delivery:  Information for the patient's newborn:  Earvin Hansen [[id number]]  08/11/2018  Type of delivery: Vaginal delivery - Vacuum or forceps assisted  no Episiotomy: No Laceration: no  Pregnancy or labor problems:  Yes, depression and shoulder dystocia Any problems since the delivery:  Yes, depression/anxiety, has improved with Zoloft  Newborn Details:  SINGLETON   Gender: Female Birth weight:  Information for the patient's newborn:  Earvin Hansen [[id number]]  7 lb 8.3 oz (3.41 kg)  Maternal Details:  Breast Feeding:  no Post partum depression/anxiety noted:  yes Edinburgh Post-Partum Depression Score:  6  Date of last PAP: 08/27/2012  normal   Review of Systems  Constitutional: Negative.   HENT: Negative.   Eyes: Negative.   Respiratory: Negative for shortness of breath and wheezing.   Cardiovascular: Negative for chest pain and palpitations.  Gastrointestinal: Positive for abdominal pain.       Mild pain/tenderness at surgical sites  Genitourinary: Negative.   Musculoskeletal: Positive for back pain.  Skin:       Surgical sites healing well  Neurological: Negative.   Endo/Heme/Allergies: Negative.   Psychiatric/Behavioral: Positive for depression. The patient is nervous/anxious.     Past Medical History:  Past Medical History:  Diagnosis Date  . Asthma    H/O NO INHALERS  . Depression   . GERD (gastroesophageal reflux disease)    H/O WITH PREGNANCY  . Headache    MIGRAINES    Past Surgical History:  Past Surgical History:  Procedure Laterality Date  . deviated septum repair    . GANGLION CYST EXCISION    . LAPAROSCOPIC BILATERAL SALPINGECTOMY Bilateral 09/18/2018   Procedure: with Incidental  LAPAROSCOPIC BILATERAL PORTION SALPINGECTOMY;  Surgeon: Conard Novak, MD;  Location: [facility] ORS;  Service: Gynecology;  Laterality: Bilateral;  . LAPAROSCOPIC TUBAL LIGATION Bilateral 09/18/2018   Procedure: LAPAROSCOPIC TUBAL LIGATION;  Surgeon: Conard Novak, MD;  Location: [facility] ORS;  Service: Gynecology;  Laterality: Bilateral;  . TONSILLECTOMY AND ADENOIDECTOMY     PART ADENOID  . tubes in ears    . WISDOM TOOTH EXTRACTION     BOTTOM TWO    Family History:  Family History  Problem Relation Age of Onset  . Diabetes Maternal Grandmother   . Heart disease Maternal Grandfather   . Diabetes Paternal Grandmother   . Alzheimer's disease Paternal Grandmother   . Cancer Other        OVARIAN    Social History:  Social History   Socioeconomic History  . Marital status: Married    Spouse name: Not on file  . Number of children: 3  . Years of education: 55  . Highest education level: Not on file  Occupational History  . Occupation: STAY AT HOME MOM  Social Needs  . Financial resource strain: Not on file  . Food insecurity:    Worry: Not on file    Inability: Not on file  . Transportation needs:    Medical: Not on file    Non-medical: Not on file  Tobacco Use  . Smoking status: Never Smoker  . Smokeless tobacco: Never Used  Substance and Sexual Activity  . Alcohol use: Yes    Comment: WINE RARE  .  Drug use: Never  . Sexual activity: Not Currently    Birth control/protection: None, Surgical    Comment: BTL  Lifestyle  . Physical activity:    Days per week: Not on file    Minutes per session: Not on file  . Stress: Not on file  Relationships  . Social connections:    Talks on phone: Not on file    Gets together: Not on file    Attends religious service: Not on file    Active member of club or organization: Not on file    Attends meetings of clubs or organizations: Not on file    Relationship status: Not on file  . Intimate partner violence:    Fear of  current or ex partner: Not on file    Emotionally abused: Not on file    Physically abused: Not on file    Forced sexual activity: Not on file  Other Topics Concern  . Not on file  Social History Narrative  . Not on file    Allergies:  No Known Allergies  Medications: Prior to Admission medications   Medication Sig Start Date End Date Taking? Authorizing Provider  HYDROcodone-acetaminophen (NORCO) 5-325 MG tablet Take 1 tablet by mouth every 6 (six) hours as needed (breakthrough pain). 09/18/18  Yes Conard Novak, MD  ibuprofen (ADVIL,MOTRIN) 600 MG tablet Take 1 tablet (600 mg total) by mouth every 6 (six) hours as needed for mild pain or cramping. 09/18/18  Yes Conard Novak, MD  sertraline (ZOLOFT) 50 MG tablet Take 1 tablet (50 mg total) by mouth daily. Patient taking differently: Take 50 mg by mouth every morning.  08/13/18  Yes Tresea Mall, CNM    Physical Exam Vitals:  Vitals:   09/22/18 0934  BP: 100/60   General: NAD HEENT: normocephalic, anicteric Pulmonary: No increased work of breathing, CTAB Cardiovascular: S1 ans S2 auscultated, no murmurs, rubs, or gallops Abdomen: Soft, non-tender, non-distended.  Umbilicus without lesions.  No hepatomegaly, splenomegaly or masses palpable. No evidence of hernia. Surgical sites from tubal ligation surgery healing well, no drainage, minimal bruising at left lateral site Genitourinary:  External: Normal external female genitalia.  Normal urethral  meatus, normal Bartholin's and Skene's glands.    Vagina: Normal vaginal mucosa, no evidence of prolapse.    Cervix: Grossly normal in appearance, small amount of  menstrual blood present  Uterus: Non-enlarged, mobile, normal contour.  No CMT  Adnexa: ovaries non-enlarged, no adnexal masses  Rectal: deferred Extremities: no edema, erythema, or tenderness Neurologic: Grossly intact Psychiatric: mood appropriate, affect full  Assessment: 38 y.o. B1D1761 presenting for a 6  week postpartum visit  Plan: Problem List Items Addressed This Visit    None    Visit Diagnoses    Postpartum care and examination    -  Primary   Pap smear for cervical cancer screening       Relevant Orders   Cytology - PAP     1) Contraception: She has had a tubal ligation.  2)  Pap - ASCCP guidelines and rationale discussed.  Patient opts for regular screening interval. Pap done today.  3) Patient underwent screening for postpartum anxiety/depression with some concerns noted. Feeling better with Zoloft, still anxious sometimes but overall medication is working well. EPDS score is 6 today (was 15 post-delivery).  4) Follow up in two weeks for post-surgical visit with Dr. Jean Rosenthal.  Marcelyn Bruins, CNM 09/22/2018

## 2018-09-25 ENCOUNTER — Encounter: Payer: Self-pay | Admitting: Obstetrics and Gynecology

## 2018-09-26 LAB — CYTOLOGY - PAP
Diagnosis: UNDETERMINED — AB
HPV 16/18/45 genotyping: NEGATIVE
HPV: DETECTED — AB

## 2018-10-01 ENCOUNTER — Ambulatory Visit (INDEPENDENT_AMBULATORY_CARE_PROVIDER_SITE_OTHER): Payer: Medicaid Other | Admitting: Obstetrics and Gynecology

## 2018-10-01 ENCOUNTER — Ambulatory Visit: Payer: Medicaid Other | Admitting: Obstetrics and Gynecology

## 2018-10-01 ENCOUNTER — Encounter: Payer: Self-pay | Admitting: Obstetrics and Gynecology

## 2018-10-01 ENCOUNTER — Other Ambulatory Visit: Payer: Self-pay

## 2018-10-01 VITALS — BP 122/78 | Ht 62.0 in | Wt 200.0 lb

## 2018-10-01 DIAGNOSIS — Z09 Encounter for follow-up examination after completed treatment for conditions other than malignant neoplasm: Secondary | ICD-10-CM

## 2018-10-01 NOTE — Progress Notes (Signed)
   Postoperative Follow-up Patient presents post op from laparoscopic bilateral tubal ligation (via partial salpingectomy) 2 weeks ago for desires permanent sterility.  Subjective: Eating a regular diet without difficulty. The patient is not having any pain.  Activity: normal activities of daily living.  Objective: Vitals:   10/01/18 0938  BP: 122/78   Vital Signs: BP 122/78   Ht 5\' 2"  (1.575 m)   Wt 200 lb (90.7 kg)   LMP 09/19/2018   BMI 36.58 kg/m  Constitutional: Well nourished, well developed female in no acute distress.  HEENT: normal Skin: Warm and dry.  Extremity: no edema  Abdomen: Soft, non-tender, normal bowel sounds; no bruits, organomegaly or masses. clean, dry, intact and no erythema, induration, warmth, and tenderness at any incision site    Assessment: 38 y.o. s/p bilateral partial salpingectomy for tubal ligation progressing well  Plan: Patient has done well after surgery with no apparent complications.  I have discussed the post-operative course to date, and the expected progress moving forward.  The patient understands what complications to be concerned about.  I will see the patient in routine follow up, or sooner if needed.    Activity plan: No restriction.  Follow up in one year for annual or prn. Encouraged her to attend her colposcopy.  [name], MD 10/01/2018, 9:43 AM

## 2018-10-10 ENCOUNTER — Other Ambulatory Visit: Payer: Self-pay

## 2018-10-10 ENCOUNTER — Other Ambulatory Visit (HOSPITAL_COMMUNITY)
Admission: RE | Admit: 2018-10-10 | Discharge: 2018-10-10 | Disposition: A | Discharge status: Home / Self Care | Payer: Medicaid Other | Source: Ambulatory Visit | Attending: Obstetrics and Gynecology | Admitting: Obstetrics and Gynecology

## 2018-10-10 ENCOUNTER — Encounter: Payer: Self-pay | Admitting: Obstetrics and Gynecology

## 2018-10-10 ENCOUNTER — Ambulatory Visit (INDEPENDENT_AMBULATORY_CARE_PROVIDER_SITE_OTHER): Payer: Medicaid Other | Admitting: Obstetrics and Gynecology

## 2018-10-10 VITALS — BP 112/72 | HR 70 | Ht 63.0 in | Wt 198.0 lb

## 2018-10-10 DIAGNOSIS — N870 Mild cervical dysplasia: Secondary | ICD-10-CM | POA: Diagnosis not present

## 2018-10-10 DIAGNOSIS — R87810 Cervical high risk human papillomavirus (HPV) DNA test positive: Secondary | ICD-10-CM | POA: Insufficient documentation

## 2018-10-10 DIAGNOSIS — R87610 Atypical squamous cells of undetermined significance on cytologic smear of cervix (ASC-US): Secondary | ICD-10-CM | POA: Insufficient documentation

## 2018-10-10 NOTE — Progress Notes (Signed)
   GYNECOLOGY CLINIC COLPOSCOPY PROCEDURE NOTE  38 y.o. [id number] here for colposcopy for ASCUS with POSITIVE high risk HPV  pap smear on 09/22/2018. Discussed underlying role for HPV infection in the development of cervical dysplasia, its natural history and progression/regression, need for surveillance.  Is the patient  pregnant: No LMP: Patient's last menstrual period was 09/19/2018. Smoking status:  reports that she has never smoked. She has never used smokeless tobacco.  Patient given informed consent, signed copy in the chart, time out was performed.  The patient was position in dorsal lithotomy position. Speculum was placed the cervix was visualized.   After application of acetic acid colposcopic inspection of the cervix was undertaken.   Colposcopy adequate, full visualization of transformation zone: Yes acetowhite lesion(s) noted at 6 o'clock, tissue friable; corresponding biopsies obtained.   ECC specimen obtained:  Yes  All specimens were labeled and sent to pathology.   Patient was given post procedure instructions.  Will follow up pathology and manage accordingly.  Routine preventative health maintenance measures emphasized.  OBGyn Exam  Vena Austria, MD, [facility], [facility]

## 2019-01-20 DIAGNOSIS — H5203 Hypermetropia, bilateral: Secondary | ICD-10-CM | POA: Diagnosis not present

## 2019-01-20 DIAGNOSIS — H5213 Myopia, bilateral: Secondary | ICD-10-CM | POA: Diagnosis not present

## 2019-02-11 DIAGNOSIS — H5203 Hypermetropia, bilateral: Secondary | ICD-10-CM | POA: Diagnosis not present

## 2020-04-01 IMAGING — US US OB TRANSVAGINAL
1 series · 14 of 28 positions shown · non-contrast
Comparison: None.

CLINICAL DATA: Vaginal bleeding

EXAM:
OBSTETRIC <14 WK US AND TRANSVAGINAL OB US
TECHNIQUE: Both transabdominal and transvaginal ultrasound examinations were
performed for complete evaluation of the gestation as well as the
maternal uterus, adnexal regions, and pelvic cul-de-sac.
Transvaginal technique was performed to assess early pregnancy.

[Series 1: us ob transvaginal · 14 of 72 slices shown]
[im 3/72]
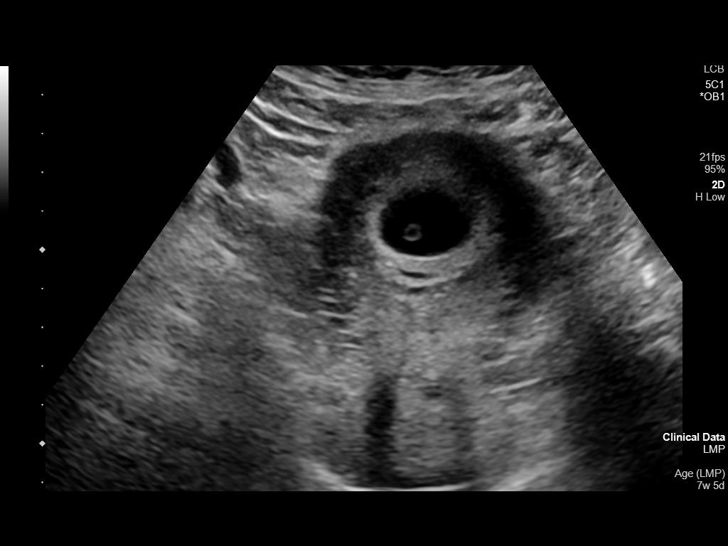
[im 8/72]
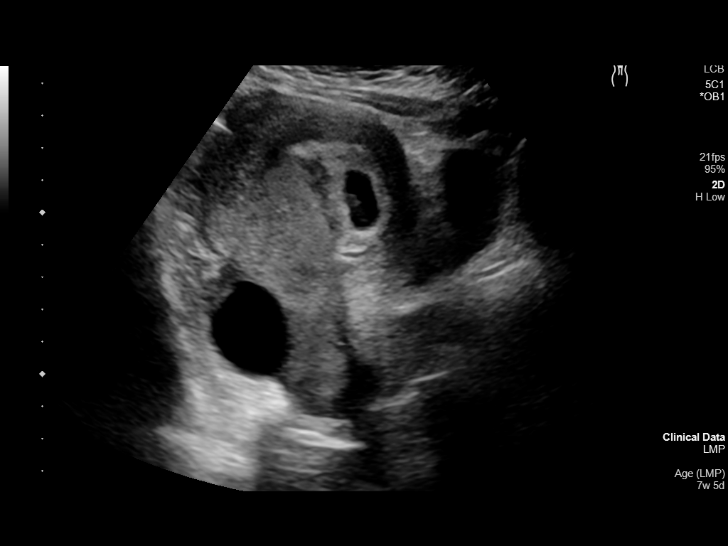
[im 14/72]
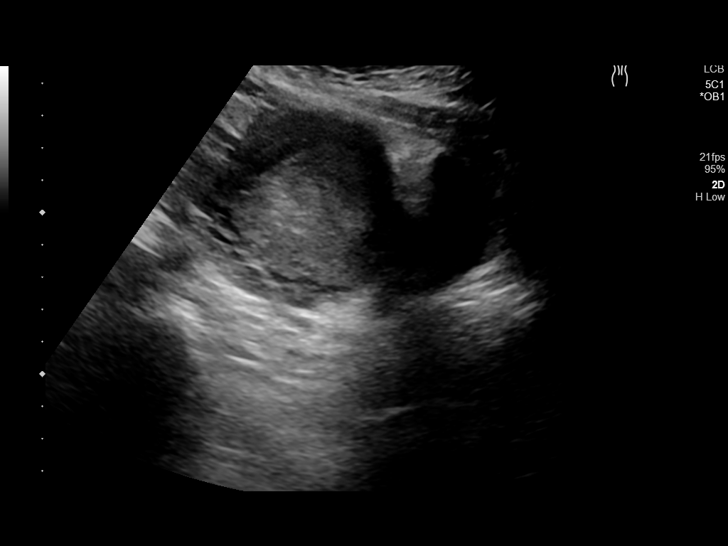
[im 19/72]
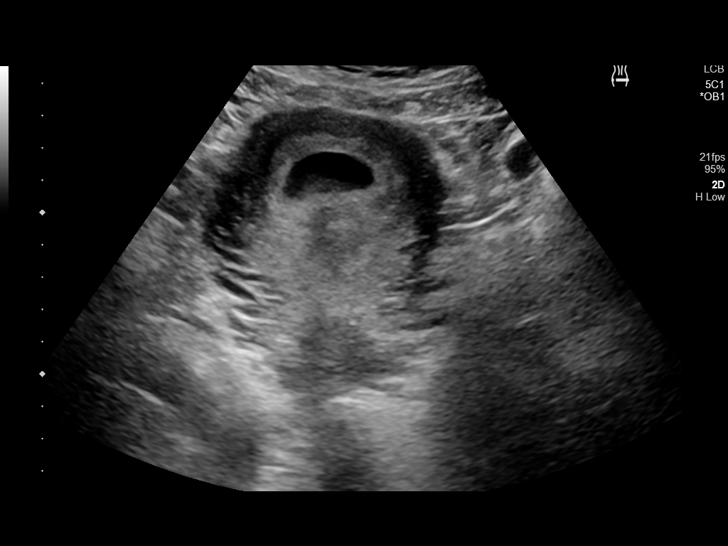
[im 24/72]
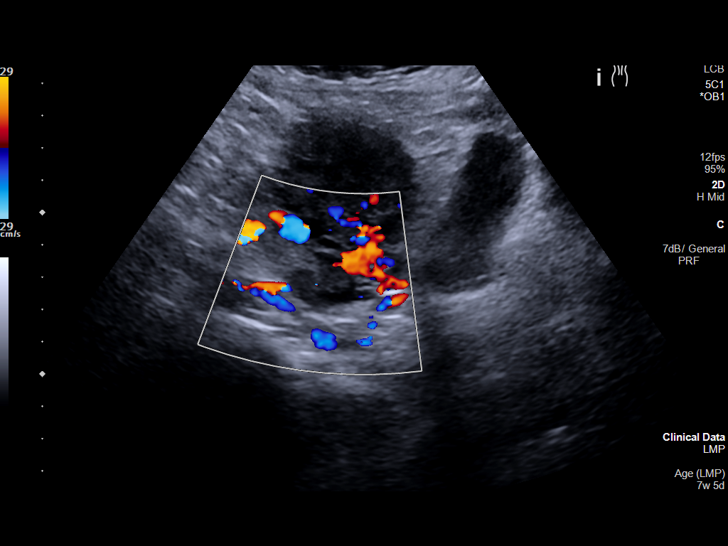
[im 29/72]
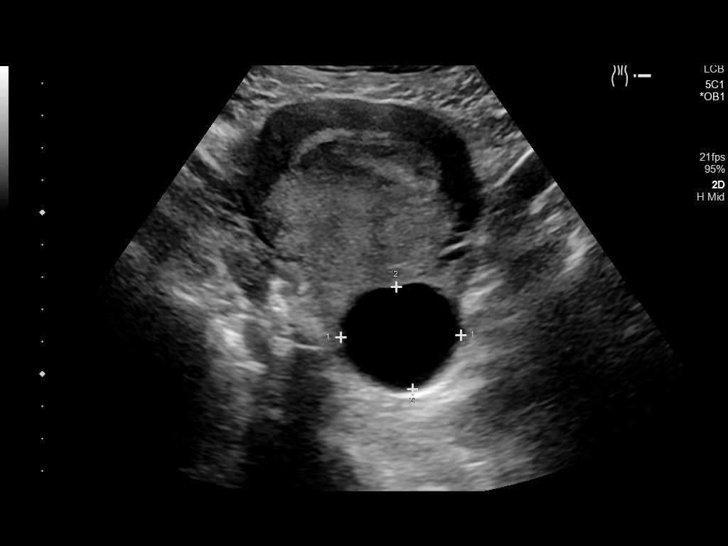
[im 35/72]
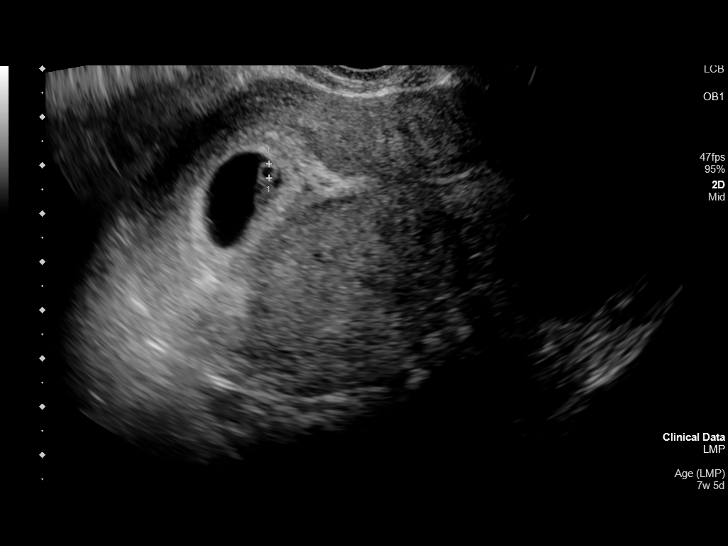
[im 40/72]
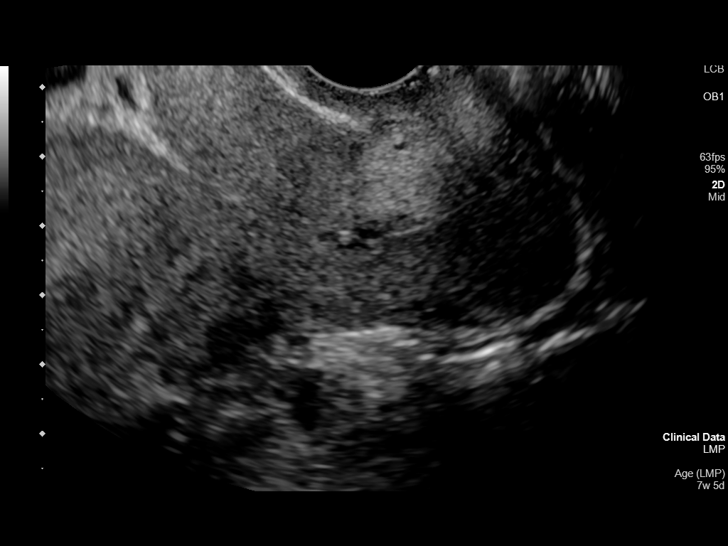
[im 45/72]
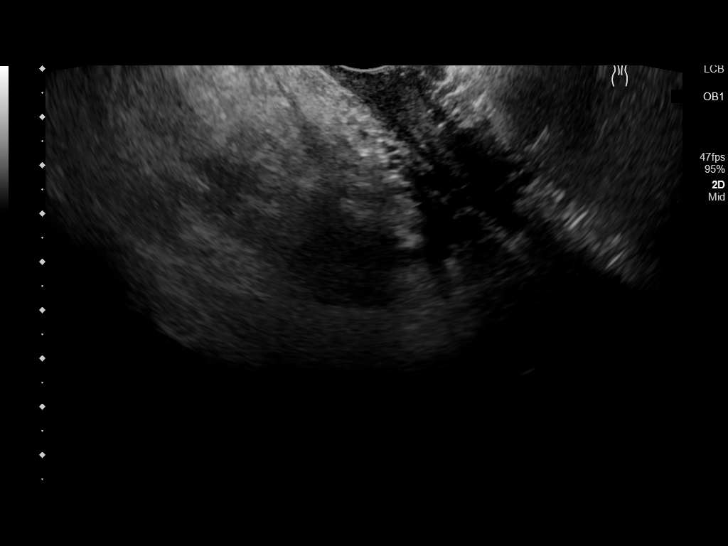
[im 50/72]
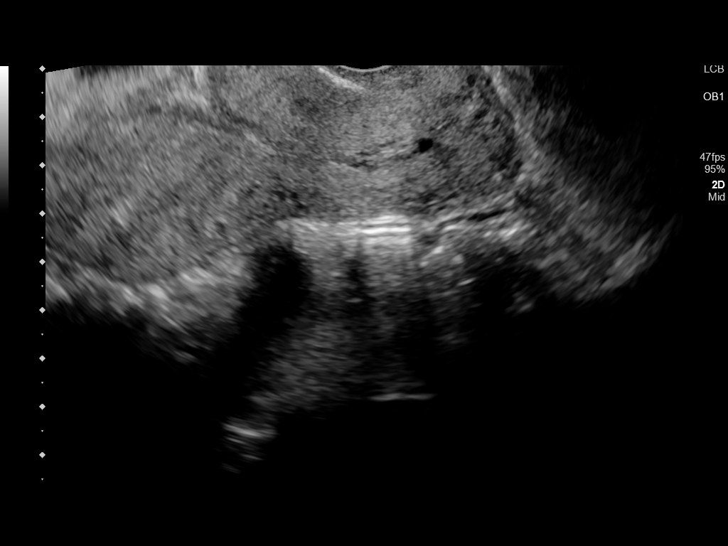
[im 56/72]
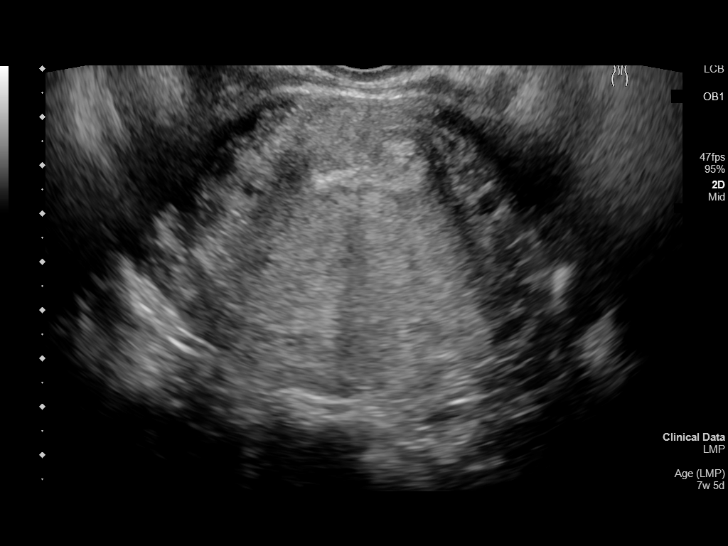
[im 61/72]
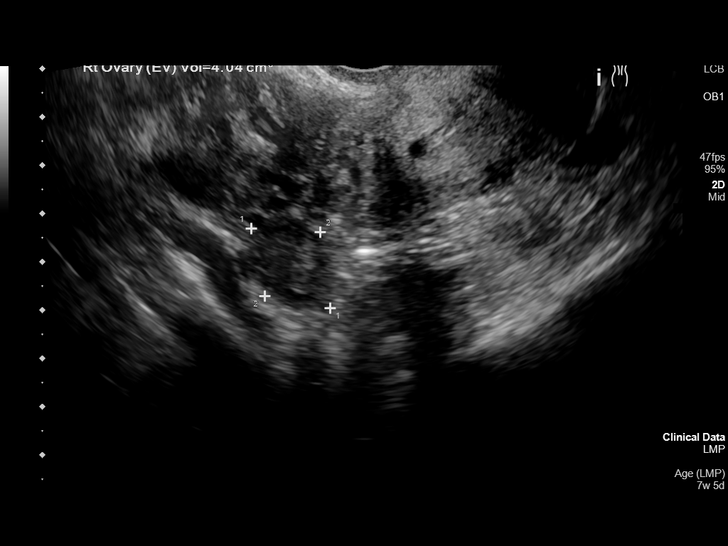
[im 66/72]
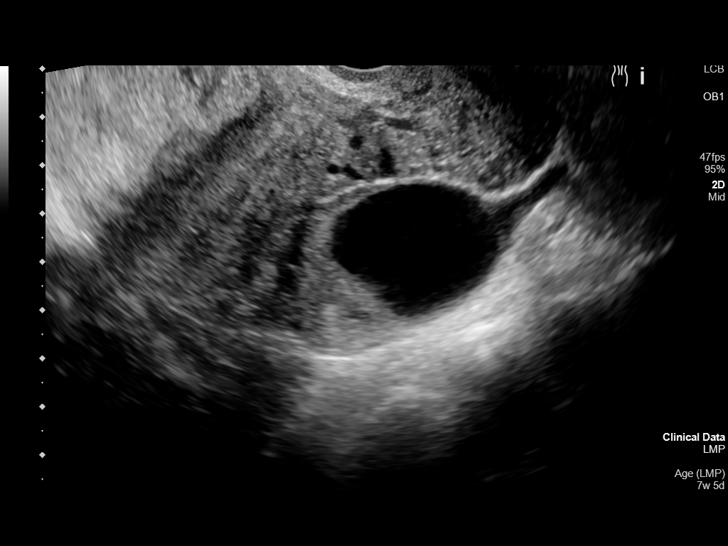
[im 72/72]
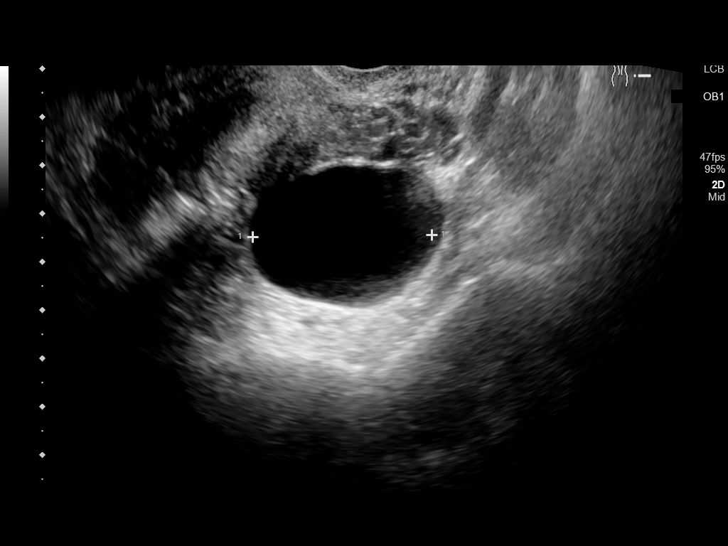

[14 of 28 positions shown; findings below may reference images not displayed]

FINDINGS: Intrauterine gestational sac: Visualized

Yolk sac:  Visualized

Embryo: Visualized

Cardiac Activity: Visualized

Heart Rate: 119 bpm

CRL:  7 mm   6 w   4 d                  US EDC: January 18, 2019

Subchorionic hemorrhage: There is a small subchorionic hemorrhage
measuring 7 x 7 mm.

Maternal uterus/adnexae: Cervical os is closed. Right ovary measures
2.3 x 1.7 x 1.9 cm. Left ovary measures 4.9 x 3.1 x 4.1 cm. There is
a cyst arising from the left ovary measuring 3.2 x 3.0 x 3.7 cm,
likely a corpus luteum. No free pelvic fluid.
IMPRESSION: Single live intrauterine gestation estimated gestational age of 6+
weeks. Small subchorionic hemorrhage.

Left ovarian cyst, a likely rather large corpus luteum. No free
pelvic fluid.

## 2020-08-04 ENCOUNTER — Ambulatory Visit: Payer: Medicaid Other | Admitting: Obstetrics and Gynecology

## 2020-08-18 ENCOUNTER — Other Ambulatory Visit: Payer: Self-pay | Admitting: Obstetrics and Gynecology

## 2020-08-18 DIAGNOSIS — Z1231 Encounter for screening mammogram for malignant neoplasm of breast: Secondary | ICD-10-CM

## 2020-09-08 ENCOUNTER — Inpatient Hospital Stay: Admission: RE | Admit: 2020-09-08 | Payer: Medicaid Other | Source: Ambulatory Visit

## 2020-09-08 DIAGNOSIS — Z1231 Encounter for screening mammogram for malignant neoplasm of breast: Secondary | ICD-10-CM

## 2021-01-16 ENCOUNTER — Other Ambulatory Visit: Payer: Self-pay

## 2021-01-16 ENCOUNTER — Ambulatory Visit
Admission: RE | Admit: 2021-01-16 | Discharge: 2021-01-16 | Disposition: A | Discharge status: Home / Self Care | Payer: Medicaid Other | Source: Ambulatory Visit | Attending: Obstetrics and Gynecology | Admitting: Obstetrics and Gynecology

## 2021-01-16 DIAGNOSIS — Z1231 Encounter for screening mammogram for malignant neoplasm of breast: Secondary | ICD-10-CM

## 2021-08-01 DIAGNOSIS — N870 Mild cervical dysplasia: Secondary | ICD-10-CM | POA: Insufficient documentation

## 2021-08-01 NOTE — Progress Notes (Signed)
PCP:  Patient, No Pcp Per (Inactive)   Chief Complaint  Patient presents with   Gynecologic Exam    No concerns     HPI:      Robin French is a 41 y.o. [id number] whose LMP was Patient's last menstrual period was 07/09/2021 (approximate)., presents today for her annual examination.  Her menses are regular every 28-30 days, lasting 4 days, mod to heavy flow.  Dysmenorrhea mild, occurring first 1-2 days of flow. She does not have intermenstrual bleeding.  Sex activity: single partner, contraception - tubal ligation.  Last Pap: 09/22/18 Results were: ASCUS with POSITIVE high risk HPV ; s/p colpo with Dr. Georgianne Fick 10/10/18 with CIN 1 on colpo bx. Due for repeat pap today Hx of STDs: HPV  Last mammogram: 01/16/21 Results were: normal--routine follow-up in 12 months There is no FH of breast cancer. There is a FH of ovarian or uterine cancer in her PGGM, genetic testing not indicated for pr. The patient does do self-breast exams.  Tobacco use: The patient denies current or previous tobacco use. Alcohol use: none No drug use.  Exercise: not active  She does get adequate calcium but not Vitamin D in her diet.  Was on sertraline 50 mg for anxiety/depression in 2020. Stopped for a year or so but would like to restart. Has more irritability/worry and feels down about her wt gain. She has 4 kids and 1 income, so life is stressful. Has tried decreased calories/diet changes without relief. Always feels hungry. Not exercising. FH hypothyroidism and DM. No recent labs.  Patient Active Problem List   Diagnosis Date Noted   Dysplasia of cervix, low grade (CIN 1) 08/01/2021   Encounter for sterilization 09/18/2018   Adult BMI 31.0-31.9 kg/sq m 12/20/2017    Past Surgical History:  Procedure Laterality Date   deviated septum repair     GANGLION CYST EXCISION     LAPAROSCOPIC BILATERAL SALPINGECTOMY Bilateral 09/18/2018   Procedure: with Incidental LAPAROSCOPIC BILATERAL PORTION  SALPINGECTOMY;  Surgeon: Will Bonnet, MD;  Location: [facility] ORS;  Service: Gynecology;  Laterality: Bilateral;   LAPAROSCOPIC TUBAL LIGATION Bilateral 09/18/2018   Procedure: LAPAROSCOPIC TUBAL LIGATION;  Surgeon: Will Bonnet, MD;  Location: [facility] ORS;  Service: Gynecology;  Laterality: Bilateral;   TONSILLECTOMY AND ADENOIDECTOMY     PART ADENOID   tubes in ears     WISDOM TOOTH EXTRACTION     BOTTOM TWO    Family History  Problem Relation Age of Onset   Diabetes Maternal Grandmother    Heart disease Maternal Grandfather    Diabetes Paternal Grandmother    Alzheimer's disease Paternal Grandmother    Ovarian cancer Other     Social History   Socioeconomic History   Marital status: Married    Spouse name: Not on file   Number of children: 3   Years of education: 14   Highest education level: Not on file  Occupational History   Occupation: STAY AT HOME MOM  Tobacco Use   Smoking status: Never   Smokeless tobacco: Never  Vaping Use   Vaping Use: Never used  Substance and Sexual Activity   Alcohol use: Yes    Comment: WINE RARE   Drug use: Never   Sexual activity: Yes    Birth control/protection: None, Surgical    Comment: BTL  Other Topics Concern   Not on file  Social History Narrative   Not on file   Social Determinants of Health  Financial Resource Strain: Not on file  Food Insecurity: Not on file  Transportation Needs: Not on file  Physical Activity: Not on file  Stress: Not on file  Social Connections: Not on file  Intimate Partner Violence: Not on file     Current Outpatient Medications:    sertraline (ZOLOFT) 50 MG tablet, Take 1/2 tab daily for 6 days, then 1 tab daily, Disp: 30 tablet, Rfl: 1     ROS:  Review of Systems  Constitutional:  Negative for fatigue, fever and unexpected weight change.  Respiratory:  Negative for cough, shortness of breath and wheezing.   Cardiovascular:  Negative for chest pain, palpitations and leg  swelling.  Gastrointestinal:  Negative for blood in stool, constipation, diarrhea, nausea and vomiting.  Endocrine: Negative for cold intolerance, heat intolerance and polyuria.  Genitourinary:  Negative for dyspareunia, dysuria, flank pain, frequency, genital sores, hematuria, menstrual problem, pelvic pain, urgency, vaginal bleeding, vaginal discharge and vaginal pain.  Musculoskeletal:  Negative for back pain, joint swelling and myalgias.  Skin:  Negative for rash.  Neurological:  Negative for dizziness, syncope, light-headedness, numbness and headaches.  Hematological:  Negative for adenopathy.  Psychiatric/Behavioral:  Negative for agitation, confusion, sleep disturbance and suicidal ideas. The patient is not nervous/anxious.   BREAST: No symptoms   Objective: BP 110/80    Ht 5' 2.5" (1.588 m)    Wt 172 lb (78 kg)    LMP 07/09/2021 (Approximate)    BMI 30.96 kg/m    Physical Exam Constitutional:      Appearance: She is well-developed.  Genitourinary:     Vulva normal.     Right Labia: No rash, tenderness or lesions.    Left Labia: No tenderness, lesions or rash.    No vaginal discharge, erythema or tenderness.      Right Adnexa: not tender and no mass present.    Left Adnexa: not tender and no mass present.    No cervical friability or polyp.     Uterus is not enlarged or tender.  Breasts:    Right: No mass, nipple discharge, skin change or tenderness.     Left: No mass, nipple discharge, skin change or tenderness.  Neck:     Thyroid: No thyromegaly.  Cardiovascular:     Rate and Rhythm: Normal rate and regular rhythm.     Heart sounds: Normal heart sounds. No murmur heard. Pulmonary:     Effort: Pulmonary effort is normal.     Breath sounds: Normal breath sounds.  Abdominal:     Palpations: Abdomen is soft.     Tenderness: There is no abdominal tenderness. There is no guarding or rebound.  Musculoskeletal:        General: Normal range of motion.     Cervical back:  Normal range of motion.  Lymphadenopathy:     Cervical: No cervical adenopathy.  Neurological:     General: No focal deficit present.     Mental Status: She is alert and oriented to person, place, and time.     Cranial Nerves: No cranial nerve deficit.  Skin:    General: Skin is warm and dry.  Psychiatric:        Mood and Affect: Mood normal.        Behavior: Behavior normal.        Thought Content: Thought content normal.        Judgment: Judgment normal.  Vitals reviewed.    Results: GAD 7 : Generalized Anxiety Score 08/02/2021  Nervous, Anxious, on Edge 0  Control/stop worrying 1  Worry too much - different things 0  Trouble relaxing 0  Restless 0  Easily annoyed or irritable 1  Afraid - awful might happen 0  Total GAD 7 Score 2  Anxiety Difficulty Not difficult at all    Depression screen [facility] 2/9 08/02/2021  Decreased Interest 1  Down, Depressed, Hopeless 1  PHQ - 2 Score 2  Altered sleeping 0  Tired, decreased energy 1  Change in appetite 1  Feeling bad or failure about yourself  0  Trouble concentrating 0  Moving slowly or fidgety/restless 0  Suicidal thoughts 0  PHQ-9 Score 4  Difficult doing work/chores Somewhat difficult     Assessment/Plan: Encounter for annual routine gynecological examination  Cervical cancer screening - Plan: Cytology - PAP  Screening for HPV (human papillomavirus) - Plan: Cytology - PAP  Dysplasia of cervix, low grade (CIN 1) - Plan: Cytology - PAP; repeat today. Will call with results.   Encounter for screening mammogram for malignant neoplasm of breast - Plan: MM 3D SCREEN BREAST BILATERAL; [facility]  Anxiety and depression - Plan: sertraline (ZOLOFT) 50 MG tablet; mild sx per GAD/PHQ. May be more situational due to 4 kids/busy life. Rx RF sertraline since did well before. Pt to f/u via phone in 7 wks with sx. If sx improved, will RF meds. Add exericse.   Screening for diabetes mellitus - Plan: Hemoglobin  A1c  Thyroid disorder screening - Plan: TSH + free T4  Weight gain - Plan: TSH + free T4, Hemoglobin A1c; pt with wt gain, unable to lose wt. Has tried cutting calories and cutting carbs. Stopped sugar drinks, not exercising. FH hypothyroidism and DM. Check labs. Will f/u with results. If WNL, discussed other diet options such as keto with intermittent fasting. Add exercise.   BMI 30.0-30.9,adult - Plan: TSH + free T4, Hemoglobin A1c  Meds ordered this encounter  Medications   sertraline (ZOLOFT) 50 MG tablet    Sig: Take 1/2 tab daily for 6 days, then 1 tab daily    Dispense:  30 tablet    Refill:  1    Order Specific Question:   Supervising Provider    Answer:   Gae Dry [id number]             GYN counsel breast self exam, mammography screening, adequate intake of calcium and vitamin D, diet and exercise     F/U  Return in about 1 year (around 08/02/2022).  Jinnie Onley B. Dariya Gainer, PA-C 08/02/2021 11:11 AM

## 2021-08-02 ENCOUNTER — Other Ambulatory Visit: Payer: Self-pay

## 2021-08-02 ENCOUNTER — Other Ambulatory Visit (HOSPITAL_COMMUNITY)
Admission: RE | Admit: 2021-08-02 | Discharge: 2021-08-02 | Disposition: A | Discharge status: Home / Self Care | Payer: Medicaid Other | Source: Ambulatory Visit | Attending: Obstetrics and Gynecology | Admitting: Obstetrics and Gynecology

## 2021-08-02 ENCOUNTER — Encounter: Payer: Self-pay | Admitting: Obstetrics and Gynecology

## 2021-08-02 ENCOUNTER — Ambulatory Visit (INDEPENDENT_AMBULATORY_CARE_PROVIDER_SITE_OTHER): Payer: Medicaid Other | Admitting: Obstetrics and Gynecology

## 2021-08-02 VITALS — BP 110/80 | Ht 62.5 in | Wt 172.0 lb

## 2021-08-02 DIAGNOSIS — Z131 Encounter for screening for diabetes mellitus: Secondary | ICD-10-CM | POA: Diagnosis not present

## 2021-08-02 DIAGNOSIS — N870 Mild cervical dysplasia: Secondary | ICD-10-CM | POA: Diagnosis not present

## 2021-08-02 DIAGNOSIS — Z1231 Encounter for screening mammogram for malignant neoplasm of breast: Secondary | ICD-10-CM

## 2021-08-02 DIAGNOSIS — Z124 Encounter for screening for malignant neoplasm of cervix: Secondary | ICD-10-CM | POA: Insufficient documentation

## 2021-08-02 DIAGNOSIS — Z01419 Encounter for gynecological examination (general) (routine) without abnormal findings: Secondary | ICD-10-CM

## 2021-08-02 DIAGNOSIS — F32A Depression, unspecified: Secondary | ICD-10-CM

## 2021-08-02 DIAGNOSIS — Z1151 Encounter for screening for human papillomavirus (HPV): Secondary | ICD-10-CM

## 2021-08-02 DIAGNOSIS — Z6830 Body mass index (BMI) 30.0-30.9, adult: Secondary | ICD-10-CM

## 2021-08-02 DIAGNOSIS — Z1329 Encounter for screening for other suspected endocrine disorder: Secondary | ICD-10-CM

## 2021-08-02 DIAGNOSIS — R635 Abnormal weight gain: Secondary | ICD-10-CM | POA: Diagnosis not present

## 2021-08-02 DIAGNOSIS — F419 Anxiety disorder, unspecified: Secondary | ICD-10-CM

## 2021-08-02 MED ORDER — SERTRALINE HCL 50 MG PO TABS: ORAL_TABLET | ORAL | 1 refills | Status: AC

## 2021-08-02 NOTE — Patient Instructions (Signed)
I value your feedback and you entrusting us with your care. If you get a [facility] patient survey, I would appreciate you taking the time to let us know about your experience today. Thank you!  [facility]: [phone]      

## 2021-08-03 LAB — CYTOLOGY - PAP
Comment: NEGATIVE
Diagnosis: NEGATIVE
High risk HPV: NEGATIVE

## 2021-08-03 LAB — TSH+FREE T4
Free T4: 1.23 ng/dL (ref 0.82–1.77)
TSH: 1.79 u[IU]/mL (ref 0.450–4.500)

## 2021-08-03 LAB — HEMOGLOBIN A1C
Est. average glucose Bld gHb Est-mCnc: 108 mg/dL
Hgb A1c MFr Bld: 5.4 % (ref 4.8–5.6)

## 2021-12-15 ENCOUNTER — Encounter: Payer: Self-pay | Admitting: Obstetrics and Gynecology

## 2022-01-18 ENCOUNTER — Ambulatory Visit
Admission: RE | Admit: 2022-01-18 | Discharge: 2022-01-18 | Disposition: A | Discharge status: Home / Self Care | Payer: Medicaid Other | Source: Ambulatory Visit | Attending: Obstetrics and Gynecology | Admitting: Obstetrics and Gynecology

## 2022-01-18 DIAGNOSIS — Z1231 Encounter for screening mammogram for malignant neoplasm of breast: Secondary | ICD-10-CM | POA: Insufficient documentation

## 2022-01-19 ENCOUNTER — Other Ambulatory Visit: Payer: Self-pay | Admitting: Obstetrics and Gynecology

## 2022-01-19 DIAGNOSIS — N63 Unspecified lump in unspecified breast: Secondary | ICD-10-CM

## 2022-01-19 DIAGNOSIS — R921 Mammographic calcification found on diagnostic imaging of breast: Secondary | ICD-10-CM

## 2022-01-19 DIAGNOSIS — R928 Other abnormal and inconclusive findings on diagnostic imaging of breast: Secondary | ICD-10-CM

## 2022-01-25 ENCOUNTER — Ambulatory Visit
Admission: RE | Admit: 2022-01-25 | Discharge: 2022-01-25 | Disposition: A | Discharge status: Home / Self Care | Payer: Medicaid Other | Source: Ambulatory Visit | Attending: Obstetrics and Gynecology | Admitting: Obstetrics and Gynecology

## 2022-01-25 DIAGNOSIS — R921 Mammographic calcification found on diagnostic imaging of breast: Secondary | ICD-10-CM

## 2022-01-25 DIAGNOSIS — N63 Unspecified lump in unspecified breast: Secondary | ICD-10-CM | POA: Diagnosis not present

## 2022-01-25 DIAGNOSIS — R928 Other abnormal and inconclusive findings on diagnostic imaging of breast: Secondary | ICD-10-CM

## 2022-01-25 DIAGNOSIS — N6322 Unspecified lump in the left breast, upper inner quadrant: Secondary | ICD-10-CM | POA: Diagnosis not present

## 2022-01-26 ENCOUNTER — Other Ambulatory Visit: Payer: Self-pay | Admitting: Obstetrics and Gynecology

## 2022-01-26 DIAGNOSIS — N63 Unspecified lump in unspecified breast: Secondary | ICD-10-CM

## 2022-01-26 DIAGNOSIS — R921 Mammographic calcification found on diagnostic imaging of breast: Secondary | ICD-10-CM

## 2022-01-26 DIAGNOSIS — R928 Other abnormal and inconclusive findings on diagnostic imaging of breast: Secondary | ICD-10-CM

## 2022-02-19 ENCOUNTER — Ambulatory Visit
Admission: RE | Admit: 2022-02-19 | Discharge: 2022-02-19 | Disposition: A | Discharge status: Home / Self Care | Payer: Medicaid Other | Source: Ambulatory Visit | Attending: Obstetrics and Gynecology | Admitting: Obstetrics and Gynecology

## 2022-02-19 DIAGNOSIS — R921 Mammographic calcification found on diagnostic imaging of breast: Secondary | ICD-10-CM | POA: Diagnosis not present

## 2022-02-19 DIAGNOSIS — R928 Other abnormal and inconclusive findings on diagnostic imaging of breast: Secondary | ICD-10-CM | POA: Diagnosis not present

## 2022-02-19 DIAGNOSIS — R918 Other nonspecific abnormal finding of lung field: Secondary | ICD-10-CM | POA: Diagnosis not present

## 2022-02-19 DIAGNOSIS — N63 Unspecified lump in unspecified breast: Secondary | ICD-10-CM

## 2022-02-19 DIAGNOSIS — N6082 Other benign mammary dysplasias of left breast: Secondary | ICD-10-CM | POA: Diagnosis not present

## 2022-02-19 DIAGNOSIS — N6324 Unspecified lump in the left breast, lower inner quadrant: Secondary | ICD-10-CM | POA: Diagnosis not present

## 2022-02-19 HISTORY — PX: BREAST BIOPSY: SHX20

## 2022-02-20 LAB — SURGICAL PATHOLOGY

## 2022-03-13 ENCOUNTER — Encounter: Payer: Self-pay | Admitting: Obstetrics and Gynecology

## 2022-03-15 ENCOUNTER — Encounter: Payer: Self-pay | Admitting: Obstetrics and Gynecology

## 2022-03-15 ENCOUNTER — Ambulatory Visit: Payer: Medicaid Other | Admitting: Obstetrics and Gynecology

## 2022-03-15 VITALS — BP 104/70 | Ht 63.0 in | Wt 179.0 lb

## 2022-03-15 DIAGNOSIS — N644 Mastodynia: Secondary | ICD-10-CM | POA: Diagnosis not present

## 2022-03-15 MED ORDER — CELECOXIB 200 MG PO CAPS: 200.0000 mg | ORAL_CAPSULE | Freq: Two times a day (BID) | ORAL | 0 refills | Status: AC

## 2022-03-15 NOTE — Patient Instructions (Signed)
I value your feedback and you entrusting us with your care. If you get a [facility] patient survey, I would appreciate you taking the time to let us know about your experience today. Thank you! ? ? ?

## 2022-03-15 NOTE — Progress Notes (Signed)
Patient, No Pcp Per   Chief Complaint  Patient presents with   Breast Exam    Sharp pain on LB only    HPI:      [name] is a 41 y.o. [id number] whose LMP was Patient's last menstrual period was 02/27/2022 (approximate)., presents today for LT breast pain, persisting after breast bx done 02/19/22. Pt had 2 areas biopsied with pain afterwards that has persisted. Has random sharp burning pains LT outer quadrant to nipple, several times an hr. Also has achy pain under breast. Nipple is very tender to touch, almost feels raw. Notes a tube like structure on LT outer areola at the end of the day. No erythema, d/c, signs of infection. Tried tylenol, NSAIDs, heating pad, ice pack and support bra without sx relief.   01/25/22 Mammo/u/s: IMPRESSION: 1. 1 cm group of indeterminate UPPER-OUTER LEFT breast calcifications. Tissue sampling is recommended. 2. 1.1 cm indeterminate UPPER INNER LEFT breast mass-tissue sampling is recommended. 3. No abnormal appearing LEFT axillary lymph nodes.  PATHOLOGY revealed: Site A. BREAST, LEFT AT 930 O'CLOCK, 8 CM FROM NIPPLE; ULTRASOUND-GUIDED CORE NEEDLE BIOPSY: - BENIGN MAMMARY PARENCHYMA DISPLAYING COLUMNAR CELL CHANGE WITHOUT ATYPIA, FIBROADENOMATOID CHANGES, AND FOCAL PSEUDOANGIOMATOUS STROMAL HYPERPLASIA. - ASSOCIATED BLOOD AND ORGANIZING HEMATOMA. - NEGATIVE FOR ATYPICAL PROLIFERATIVE BREAST DISEASE.   PATHOLOGY revealed: Site B. BREAST, LEFT UPPER OUTER QUADRANT; STEREOTACTIC CORE NEEDLE BIOPSY: - BENIGN MAMMARY PARENCHYMA DISPLAYING COLUMNAR CELL CHANGE WITHOUT ATYPIA, FIBROCYSTIC AND APOCRINE CHANGES, AND FOCAL USUAL DUCTAL HYPERPLASIA, WITH ASSOCIATED CALCIFICATIONS. - NEGATIVE FOR ATYPICAL PROLIFERATIVE BREAST DISEASE.   Patient Active Problem List   Diagnosis Date Noted   Dysplasia of cervix, low grade (CIN 1) 08/01/2021   Encounter for sterilization 09/18/2018   Adult BMI 31.0-31.9 kg/sq m 12/20/2017    Past Surgical  History:  Procedure Laterality Date   BREAST BIOPSY Left 02/19/2022   Left breast stereo calcs, X clip- path pending   BREAST BIOPSY Left 02/19/2022   [place] Core BX, path pending   deviated septum repair     GANGLION CYST EXCISION     LAPAROSCOPIC BILATERAL SALPINGECTOMY Bilateral 09/18/2018   Procedure: with Incidental LAPAROSCOPIC BILATERAL PORTION SALPINGECTOMY;  Surgeon: Conard Novak, MD;  Location: [facility];  Service: Gynecology;  Laterality: Bilateral;   LAPAROSCOPIC TUBAL LIGATION Bilateral 09/18/2018   Procedure: LAPAROSCOPIC TUBAL LIGATION;  Surgeon: Conard Novak, MD;  Location: [facility];  Service: Gynecology;  Laterality: Bilateral;   TONSILLECTOMY AND ADENOIDECTOMY     PART ADENOID   tubes in ears     WISDOM TOOTH EXTRACTION     BOTTOM TWO    Family History  Problem Relation Age of Onset   Diabetes Maternal Grandmother    Heart disease Maternal Grandfather    Diabetes Paternal Grandmother    Alzheimer's disease Paternal Grandmother    Ovarian cancer Other     Social History   Socioeconomic History   Marital status: Married    Spouse name: Not on file   Number of children: 3   Years of education: 14   Highest education level: Not on file  Occupational History   Occupation: STAY AT HOME MOM  Tobacco Use   Smoking status: Never   Smokeless tobacco: Never  Vaping Use   Vaping Use: Never used  Substance and Sexual Activity   Alcohol use: Yes    Comment: WINE RARE   Drug use: Never   Sexual activity: Yes    Birth control/protection: None,  Surgical    Comment: BTL  Other Topics Concern   Not on file  Social History Narrative   Not on file   Social Determinants of Health   Financial Resource Strain: Not on file  Food Insecurity: Not on file  Transportation Needs: Not on file  Physical Activity: Not on file  Stress: Not on file  Social Connections: Not on file  Intimate Partner Violence: Not on file    Outpatient Medications Prior to Visit   Medication Sig Dispense Refill   sertraline (ZOLOFT) 50 MG tablet Take 1/2 tab daily for 6 days, then 1 tab daily (Patient not taking: Reported on 03/15/2022) 30 tablet 1   No facility-administered medications prior to visit.      ROS:  Review of Systems  Constitutional:  Negative for fever.  Gastrointestinal:  Negative for blood in stool, constipation, diarrhea, nausea and vomiting.  Genitourinary:  Negative for dyspareunia, dysuria, flank pain, frequency, hematuria, urgency, vaginal bleeding, vaginal discharge and vaginal pain.  Musculoskeletal:  Negative for back pain.  Skin:  Negative for rash.   BREAST: pain   OBJECTIVE:   Vitals:  BP 104/70   Ht 5\' 3"  (1.6 m)   Wt 179 lb (81.2 kg)   LMP 02/27/2022 (Approximate)   BMI 31.71 kg/m   Physical Exam Vitals reviewed.  Pulmonary:     Effort: Pulmonary effort is normal.  Chest:  Breasts:    Breasts are symmetrical.     Right: No inverted nipple, mass, nipple discharge, skin change or tenderness.     Left: Tenderness present. No inverted nipple, mass, nipple discharge or skin change.    Musculoskeletal:        General: Normal range of motion.     Cervical back: Normal range of motion.  Skin:    General: Skin is warm and dry.  Neurological:     General: No focal deficit present.     Mental Status: She is alert and oriented to person, place, and time.     Cranial Nerves: No cranial nerve deficit.  Psychiatric:        Mood and Affect: Mood normal.        Behavior: Behavior normal.        Thought Content: Thought content normal.        Judgment: Judgment normal.     Assessment/Plan: Breast pain, left - Plan: celecoxib (CELEBREX) 200 MG capsule; sx persisting after neg bx. Discussed with Dr. 04/29/2022 who recommended celebrex. Will treat for 2 wks. Cont ice/support. If sx persist, will f/u with radiology for addl imaging recommendations. Question nerve pain with post bx fluid collection at areola.    Meds ordered  this encounter  Medications   celecoxib (CELEBREX) 200 MG capsule    Sig: Take 1 capsule (200 mg total) by mouth 2 (two) times daily for 14 days.    Dispense:  28 capsule    Refill:  0    Order Specific Question:   Supervising Provider    Answer:   Lemar Livings [[id number]]      Return if symptoms worsen or fail to improve.  Woodward Klem B. Norbert Malkin, PA-C 03/15/2022 4:36 PM

## 2022-03-20 ENCOUNTER — Ambulatory Visit: Payer: Medicaid Other | Admitting: Obstetrics and Gynecology

## 2022-07-03 DIAGNOSIS — Z0000 Encounter for general adult medical examination without abnormal findings: Secondary | ICD-10-CM | POA: Diagnosis not present

## 2022-07-03 DIAGNOSIS — Z1159 Encounter for screening for other viral diseases: Secondary | ICD-10-CM | POA: Diagnosis not present

## 2022-07-03 DIAGNOSIS — Z136 Encounter for screening for cardiovascular disorders: Secondary | ICD-10-CM | POA: Diagnosis not present

## 2022-07-03 DIAGNOSIS — Z1330 Encounter for screening examination for mental health and behavioral disorders, unspecified: Secondary | ICD-10-CM | POA: Diagnosis not present

## 2022-07-03 DIAGNOSIS — G43709 Chronic migraine without aura, not intractable, without status migrainosus: Secondary | ICD-10-CM | POA: Diagnosis not present

## 2022-07-03 DIAGNOSIS — Z1331 Encounter for screening for depression: Secondary | ICD-10-CM | POA: Diagnosis not present

## 2022-07-03 DIAGNOSIS — Z7689 Persons encountering health services in other specified circumstances: Secondary | ICD-10-CM | POA: Diagnosis not present

## 2022-07-03 DIAGNOSIS — J322 Chronic ethmoidal sinusitis: Secondary | ICD-10-CM | POA: Diagnosis not present

## 2022-07-03 DIAGNOSIS — Z23 Encounter for immunization: Secondary | ICD-10-CM | POA: Diagnosis not present

## 2022-07-03 DIAGNOSIS — Z114 Encounter for screening for human immunodeficiency virus [HIV]: Secondary | ICD-10-CM | POA: Diagnosis not present

## 2022-07-03 DIAGNOSIS — Z130 Encounter for screening for diseases of the blood and blood-forming organs and certain disorders involving the immune mechanism: Secondary | ICD-10-CM | POA: Diagnosis not present

## 2022-09-03 DIAGNOSIS — G43709 Chronic migraine without aura, not intractable, without status migrainosus: Secondary | ICD-10-CM | POA: Diagnosis not present

## 2022-09-03 DIAGNOSIS — E871 Hypo-osmolality and hyponatremia: Secondary | ICD-10-CM | POA: Diagnosis not present

## 2022-09-03 DIAGNOSIS — E669 Obesity, unspecified: Secondary | ICD-10-CM | POA: Diagnosis not present

## 2022-10-08 DIAGNOSIS — J029 Acute pharyngitis, unspecified: Secondary | ICD-10-CM | POA: Diagnosis not present

## 2022-10-08 DIAGNOSIS — R070 Pain in throat: Secondary | ICD-10-CM | POA: Diagnosis not present

## 2022-10-08 DIAGNOSIS — Z20818 Contact with and (suspected) exposure to other bacterial communicable diseases: Secondary | ICD-10-CM | POA: Diagnosis not present

## 2022-11-21 DIAGNOSIS — Z6832 Body mass index (BMI) 32.0-32.9, adult: Secondary | ICD-10-CM | POA: Diagnosis not present

## 2022-11-21 DIAGNOSIS — F419 Anxiety disorder, unspecified: Secondary | ICD-10-CM | POA: Diagnosis not present

## 2022-11-21 DIAGNOSIS — F321 Major depressive disorder, single episode, moderate: Secondary | ICD-10-CM | POA: Diagnosis not present

## 2022-11-21 DIAGNOSIS — E669 Obesity, unspecified: Secondary | ICD-10-CM | POA: Diagnosis not present

## 2022-11-21 DIAGNOSIS — Z131 Encounter for screening for diabetes mellitus: Secondary | ICD-10-CM | POA: Diagnosis not present

## 2022-11-21 DIAGNOSIS — Z1330 Encounter for screening examination for mental health and behavioral disorders, unspecified: Secondary | ICD-10-CM | POA: Diagnosis not present

## 2022-11-21 DIAGNOSIS — J329 Chronic sinusitis, unspecified: Secondary | ICD-10-CM | POA: Diagnosis not present

## 2022-11-21 DIAGNOSIS — G43709 Chronic migraine without aura, not intractable, without status migrainosus: Secondary | ICD-10-CM | POA: Diagnosis not present

## 2022-11-21 DIAGNOSIS — Z1331 Encounter for screening for depression: Secondary | ICD-10-CM | POA: Diagnosis not present

## 2022-12-21 DIAGNOSIS — E669 Obesity, unspecified: Secondary | ICD-10-CM | POA: Diagnosis not present

## 2022-12-31 ENCOUNTER — Other Ambulatory Visit: Payer: Self-pay | Admitting: Internal Medicine

## 2022-12-31 DIAGNOSIS — Z1231 Encounter for screening mammogram for malignant neoplasm of breast: Secondary | ICD-10-CM

## 2023-01-11 DIAGNOSIS — J310 Chronic rhinitis: Secondary | ICD-10-CM | POA: Diagnosis not present

## 2023-01-11 DIAGNOSIS — R448 Other symptoms and signs involving general sensations and perceptions: Secondary | ICD-10-CM | POA: Diagnosis not present

## 2023-01-11 DIAGNOSIS — J329 Chronic sinusitis, unspecified: Secondary | ICD-10-CM | POA: Diagnosis not present

## 2023-01-21 ENCOUNTER — Ambulatory Visit
Admission: RE | Admit: 2023-01-21 | Discharge: 2023-01-21 | Disposition: A | Discharge status: Home / Self Care | Payer: Medicaid Other | Source: Ambulatory Visit | Attending: Internal Medicine | Admitting: Internal Medicine

## 2023-01-21 DIAGNOSIS — Z1231 Encounter for screening mammogram for malignant neoplasm of breast: Secondary | ICD-10-CM | POA: Insufficient documentation

## 2023-02-12 ENCOUNTER — Ambulatory Visit: Payer: Medicaid Other | Admitting: Obstetrics and Gynecology

## 2023-02-22 DIAGNOSIS — G43109 Migraine with aura, not intractable, without status migrainosus: Secondary | ICD-10-CM | POA: Diagnosis not present

## 2023-02-22 DIAGNOSIS — E669 Obesity, unspecified: Secondary | ICD-10-CM | POA: Diagnosis not present

## 2023-04-08 DIAGNOSIS — J0140 Acute pansinusitis, unspecified: Secondary | ICD-10-CM | POA: Diagnosis not present

## 2023-04-10 DIAGNOSIS — G43109 Migraine with aura, not intractable, without status migrainosus: Secondary | ICD-10-CM | POA: Diagnosis not present

## 2023-04-10 DIAGNOSIS — E669 Obesity, unspecified: Secondary | ICD-10-CM | POA: Diagnosis not present

## 2023-04-21 IMAGING — MG MM DIGITAL SCREENING BILAT W/ TOMO AND CAD
8 series · 8 of 24 positions shown · non-contrast
Comparison: None.

CLINICAL DATA: Screening.

EXAM:
DIGITAL SCREENING BILATERAL MAMMOGRAM WITH TOMOSYNTHESIS AND CAD
TECHNIQUE: Bilateral screening digital craniocaudal and mediolateral oblique
mammograms were obtained. Bilateral screening digital breast
tomosynthesis was performed. The images were evaluated with
computer-aided detection.

[R CC synth-2D]
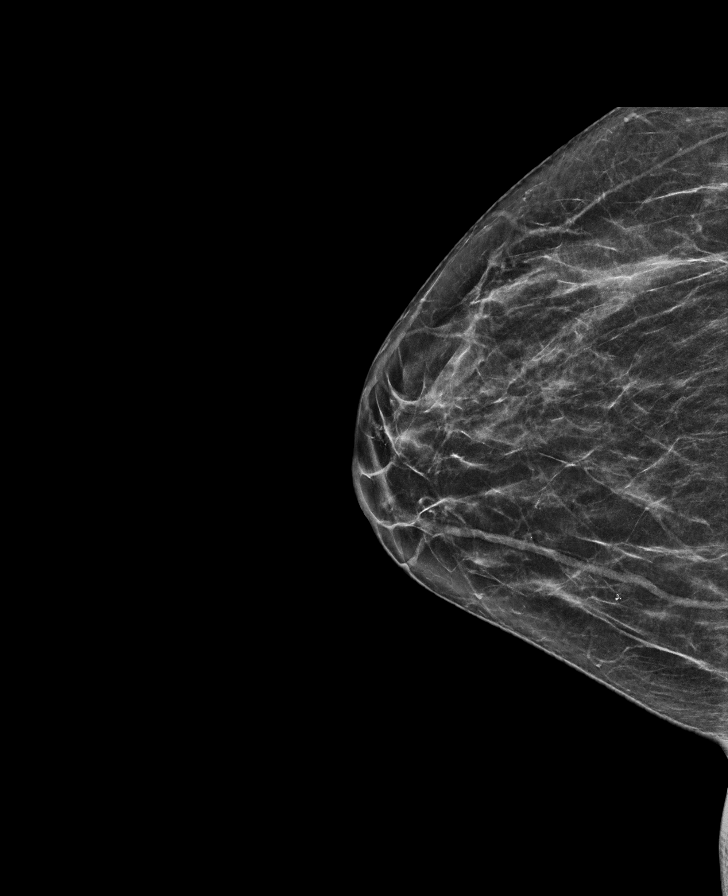

[R MLO synth-2D]
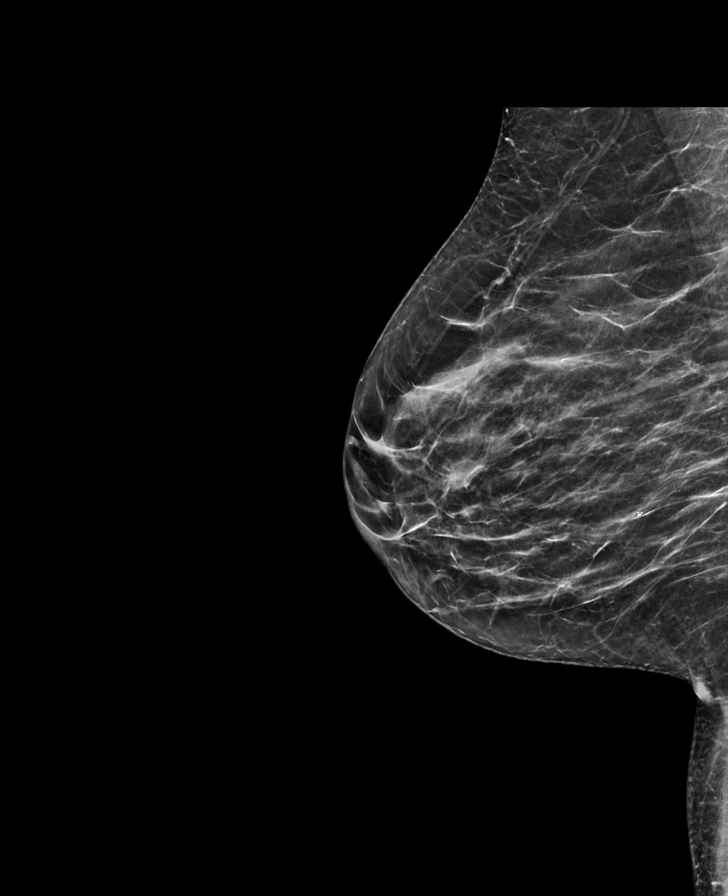

[L MLO synth-2D]
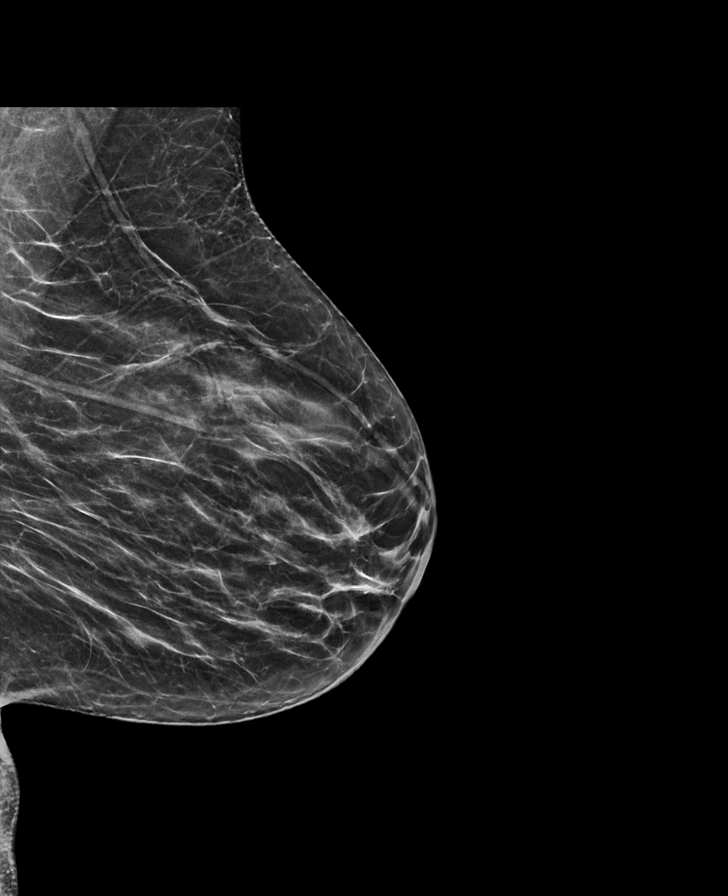

[L CC synth-2D]
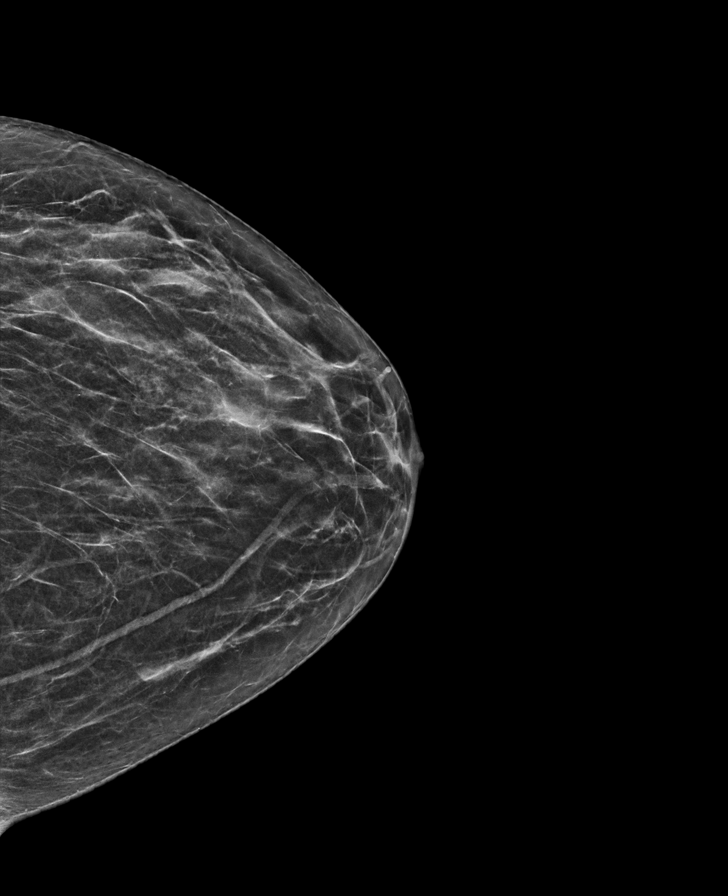

[L MLO tomo · tomo slice 29/56.0]
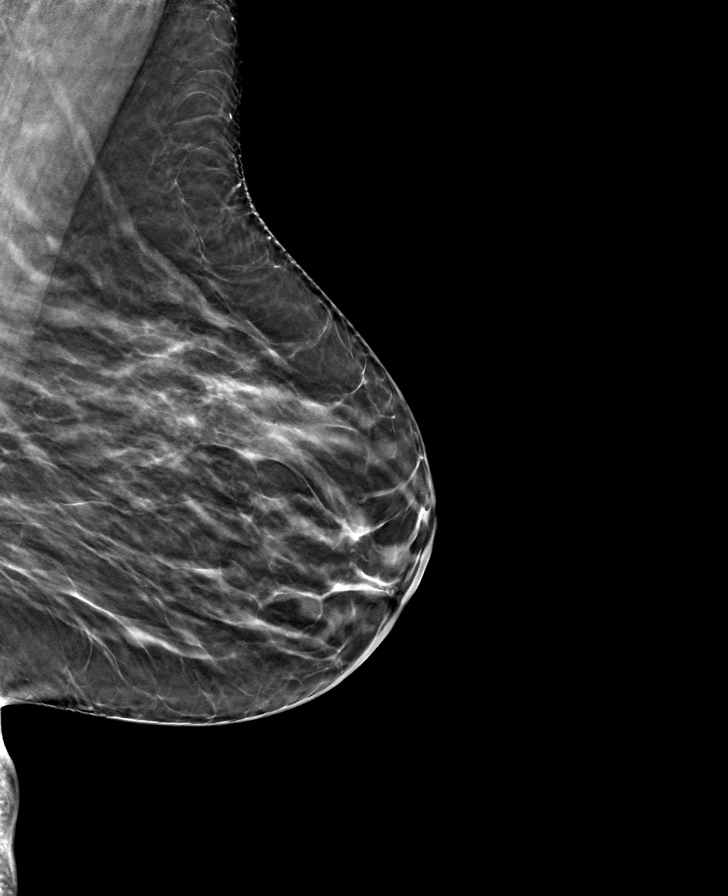

[R CC tomo · tomo slice 29/57.0]
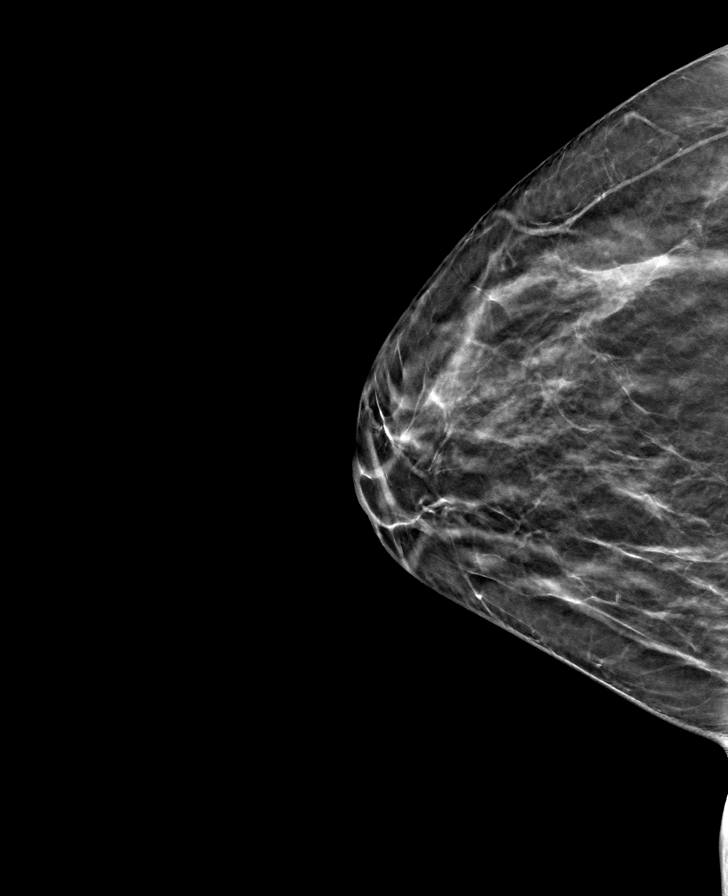

[R MLO tomo · tomo slice 27/54.0]
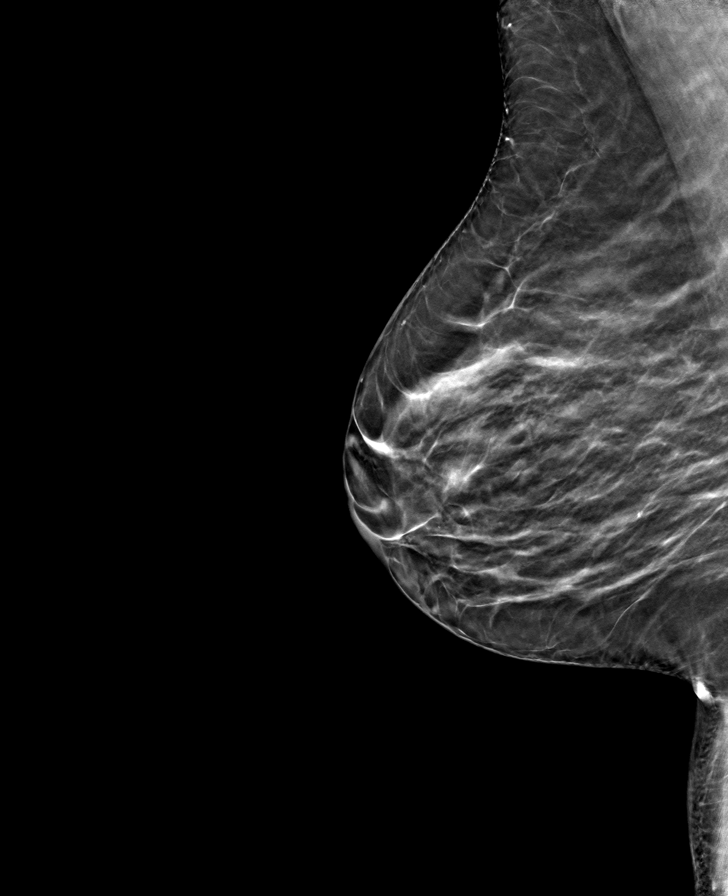

[L CC tomo · tomo slice 27/52.0]
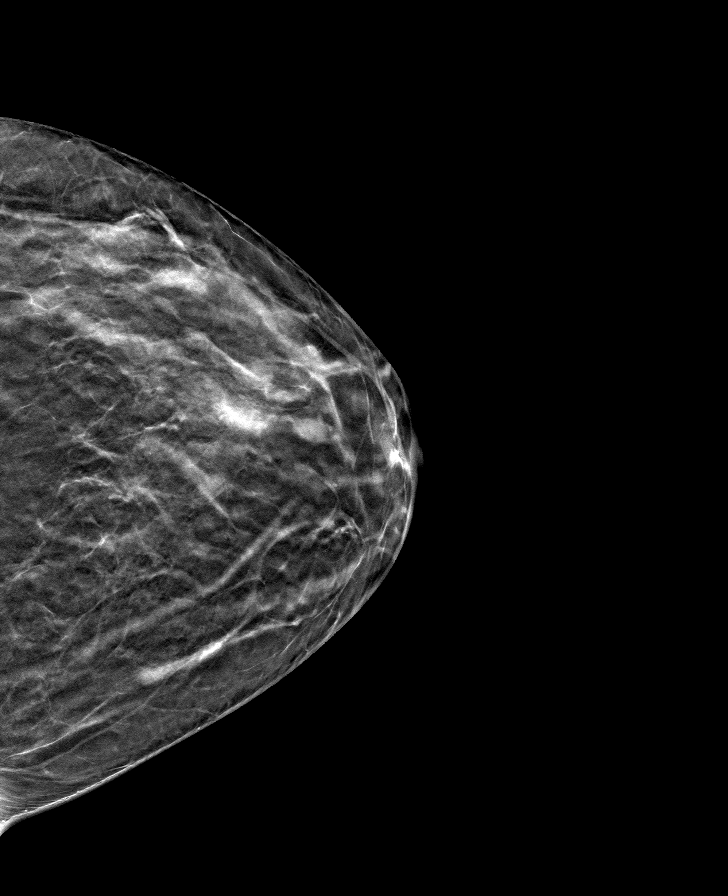

[8 of 24 positions shown; findings below may reference images not displayed]

ACR Breast Density Category b: There are scattered areas of
fibroglandular density.
FINDINGS: There are no findings suspicious for malignancy.
IMPRESSION: No mammographic evidence of malignancy. A result letter of this
screening mammogram will be mailed directly to the patient.

RECOMMENDATION:
Screening mammogram in one year. (Code:XG-X-X7B)

BI-RADS CATEGORY  1: Negative.

## 2023-05-06 DIAGNOSIS — J0140 Acute pansinusitis, unspecified: Secondary | ICD-10-CM | POA: Diagnosis not present

## 2023-06-11 DIAGNOSIS — E669 Obesity, unspecified: Secondary | ICD-10-CM | POA: Diagnosis not present

## 2023-06-11 DIAGNOSIS — Z79899 Other long term (current) drug therapy: Secondary | ICD-10-CM | POA: Diagnosis not present

## 2023-06-11 DIAGNOSIS — G43E09 Chronic migraine with aura, not intractable, without status migrainosus: Secondary | ICD-10-CM | POA: Diagnosis not present

## 2023-06-11 DIAGNOSIS — E66811 Obesity, class 1: Secondary | ICD-10-CM | POA: Diagnosis not present

## 2023-06-11 DIAGNOSIS — Z7985 Long-term (current) use of injectable non-insulin antidiabetic drugs: Secondary | ICD-10-CM | POA: Diagnosis not present

## 2023-06-11 DIAGNOSIS — G43709 Chronic migraine without aura, not intractable, without status migrainosus: Secondary | ICD-10-CM | POA: Diagnosis not present

## 2023-06-11 DIAGNOSIS — G43109 Migraine with aura, not intractable, without status migrainosus: Secondary | ICD-10-CM | POA: Diagnosis not present

## 2023-06-11 DIAGNOSIS — Z23 Encounter for immunization: Secondary | ICD-10-CM | POA: Diagnosis not present

## 2023-06-11 DIAGNOSIS — F419 Anxiety disorder, unspecified: Secondary | ICD-10-CM | POA: Diagnosis not present

## 2023-06-11 DIAGNOSIS — F321 Major depressive disorder, single episode, moderate: Secondary | ICD-10-CM | POA: Diagnosis not present

## 2023-06-11 DIAGNOSIS — Z6832 Body mass index (BMI) 32.0-32.9, adult: Secondary | ICD-10-CM | POA: Diagnosis not present

## 2023-07-18 DIAGNOSIS — U071 COVID-19: Secondary | ICD-10-CM | POA: Diagnosis not present

## 2023-07-18 DIAGNOSIS — Z20822 Contact with and (suspected) exposure to covid-19: Secondary | ICD-10-CM | POA: Diagnosis not present

## 2023-07-18 DIAGNOSIS — R059 Cough, unspecified: Secondary | ICD-10-CM | POA: Diagnosis not present

## 2023-08-16 DIAGNOSIS — Z0000 Encounter for general adult medical examination without abnormal findings: Secondary | ICD-10-CM | POA: Diagnosis not present

## 2023-09-20 DIAGNOSIS — E66811 Obesity, class 1: Secondary | ICD-10-CM | POA: Diagnosis not present

## 2023-12-02 DIAGNOSIS — E66811 Obesity, class 1: Secondary | ICD-10-CM | POA: Diagnosis not present

## 2023-12-02 DIAGNOSIS — G43109 Migraine with aura, not intractable, without status migrainosus: Secondary | ICD-10-CM | POA: Diagnosis not present

## 2024-01-14 DIAGNOSIS — G43109 Migraine with aura, not intractable, without status migrainosus: Secondary | ICD-10-CM | POA: Diagnosis not present

## 2024-03-03 DIAGNOSIS — G43109 Migraine with aura, not intractable, without status migrainosus: Secondary | ICD-10-CM | POA: Diagnosis not present

## 2024-03-03 DIAGNOSIS — E66811 Obesity, class 1: Secondary | ICD-10-CM | POA: Diagnosis not present

## 2024-06-06 DIAGNOSIS — J0100 Acute maxillary sinusitis, unspecified: Secondary | ICD-10-CM | POA: Diagnosis not present

## 2024-06-06 DIAGNOSIS — R059 Cough, unspecified: Secondary | ICD-10-CM | POA: Diagnosis not present

## 2024-06-25 DIAGNOSIS — E66811 Obesity, class 1: Secondary | ICD-10-CM | POA: Diagnosis not present
